# Patient Record
Sex: Male | Born: 1990 | Race: White | Hispanic: No | Marital: Married | Smoking: Former smoker
Health system: Southern US, Community
[De-identification: ages and names within clinical notes are randomized; demographics above are authoritative.]

## PROBLEM LIST (undated history)

## (undated) HISTORY — PX: APPENDECTOMY: SHX54

---
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
## 2009-03-28 ENCOUNTER — Emergency Department: Payer: Self-pay | Admitting: Emergency Medicine

## 2009-05-09 ENCOUNTER — Emergency Department: Payer: Self-pay | Admitting: Emergency Medicine

## 2009-05-22 ENCOUNTER — Ambulatory Visit: Payer: Self-pay | Admitting: Orthopedic Surgery

## 2009-09-19 ENCOUNTER — Ambulatory Visit: Payer: Self-pay | Admitting: Orthopedic Surgery

## 2013-06-13 ENCOUNTER — Inpatient Hospital Stay: Payer: Self-pay | Admitting: Surgery

## 2013-06-13 LAB — CBC
HCT: 47.5 % (ref 40.0–52.0)
MCHC: 35.4 g/dL (ref 32.0–36.0)
MCV: 87 fL (ref 80–100)
Platelet: 284 10*3/uL (ref 150–440)
RDW: 12.8 % (ref 11.5–14.5)
WBC: 18.6 10*3/uL — ABNORMAL HIGH (ref 3.8–10.6)

## 2013-06-13 LAB — COMPREHENSIVE METABOLIC PANEL
Albumin: 4.2 g/dL (ref 3.4–5.0)
Anion Gap: 8 (ref 7–16)
BUN: 13 mg/dL (ref 7–18)
Calcium, Total: 9.1 mg/dL (ref 8.5–10.1)
Chloride: 105 mmol/L (ref 98–107)
Co2: 24 mmol/L (ref 21–32)
EGFR (Non-African Amer.): >60
Glucose: 127 mg/dL — ABNORMAL HIGH (ref 65–99)
Osmolality: 276 (ref 275–301)
Potassium: 3.5 mmol/L (ref 3.5–5.1)
SGOT(AST): 20 U/L (ref 15–37)
Sodium: 137 mmol/L (ref 136–145)

## 2013-06-13 LAB — URINALYSIS, COMPLETE
Hyaline Cast: 14
Nitrite: NEGATIVE
Specific Gravity: 1.041 (ref 1.003–1.030)

## 2013-06-14 LAB — PATHOLOGY REPORT

## 2014-06-17 ENCOUNTER — Emergency Department: Payer: Self-pay | Admitting: Emergency Medicine

## 2014-07-19 ENCOUNTER — Emergency Department: Payer: Self-pay | Admitting: Emergency Medicine

## 2014-10-05 NOTE — H&P (Signed)
Subjective/Chief Complaint rlq abdominal pain   History of Present Illness 24 year old otherwise health male presents to ER with 20 hr history of worsening rlq abdominal pain which started  around 8 am yesterday. No previous episodes of such, some nausea and small amount of emesis.  No sick contacts, Feels hot but no objective fevers   Past History none  hand surgery secondary to a fracture.   Past Med/Surgical Hx:  denies med/surg hx:   Tonsillectomy and Adenoidectomy:   ALLERGIES:  No Known Allergies:   HOME MEDICATIONS: Medication Status  denies home meds Active   Family and Social History:  Family History Non-Contributory   Social History positive  tobacco, negative ETOH   + Tobacco Current (within 1 year)   Place of Living Home   Review of Systems:  Subjective/Chief Complaint see above.   Abdominal Pain Yes   Physical Exam:  GEN no acute distress, obese, temp 97.9 VSS.   HEENT pale conjunctivae, PERRL   NECK supple  trachea midline   RESP normal resp effort  clear BS   CARD regular rate  no murmur  No LE edema   ABD positive tenderness  no liver/spleen enlargement  no hernia  soft  normal BS  neg rovsings sign, positive RLQ focal tenderness.   LYMPH negative neck   EXTR negative cyanosis/clubbing   SKIN normal to palpation, No rashes, No ulcers   NEURO cranial nerves intact   PSYCH A+O to time, place, person, good insight   Lab Results:  Hepatic:  30-Dec-14 02:18   Bilirubin, Total 0.8  Alkaline Phosphatase 88 (45-117 NOTE: New Reference Range 05/05/13)  SGPT (ALT) 24  SGOT (AST) 20  Total Protein, Serum 7.8  Albumin, Serum 4.2  Routine Chem:  30-Dec-14 02:18   Glucose, Serum  127  BUN 13  Creatinine (comp) 0.89  Sodium, Serum 137  Potassium, Serum 3.5  Chloride, Serum 105  CO2, Serum 24  Calcium (Total), Serum 9.1  Osmolality (calc) 276  eGFR (African American) >60  eGFR (Non-African American) >60 (eGFR values <64m/min/1.73 m2  may be an indication of chronic kidney disease (CKD). Calculated eGFR is useful in patients with stable renal function. The eGFR calculation will not be reliable in acutely ill patients when serum creatinine is changing rapidly. It is not useful in  patients on dialysis. The eGFR calculation may not be applicable to patients at the low and high extremes of body sizes, pregnant women, and vegetarians.)  Anion Gap 8  Routine Hem:  30-Dec-14 02:18   WBC (CBC)  18.6  RBC (CBC) 5.49  Hemoglobin (CBC) 16.8  Hematocrit (CBC) 47.5  Platelet Count (CBC) 284 (Result(s) reported on 13 Jun 2013 at 02:52AM.)  MCV 87  MCH 30.6  MCHC 35.4  RDW 12.8   Radiology Results: LabUnknown:    30-Dec-14 02:46, CT Abdomen Pelvis WO for Stone  PACS Image  CT:  CT Abdomen Pelvis WO for Stone  REASON FOR EXAM:    flank pain  COMMENTS:   LMP: (Male)    PROCEDURE: CT  - CT ABDOMEN /PELVIS WO (STONE)  - Jun 13 2013  2:46AM     CLINICAL DATA:  Right-sided abdominal pain since this morning. Right  flank pain.    EXAM:  CT ABDOMEN AND PELVIS WITHOUT CONTRAST    TECHNIQUE:  Multidetector CT imaging of the abdomen and pelvis was performed  following the standard protocol without IV contrast.  COMPARISON:  None.    FINDINGS:  The lung bases are clear.  Small esophageal hiatal hernia.    The kidneys appear symmetrical in size and shape. No  pyelocaliectasis or ureterectasis. No renal, ureteral, or bladder  stones. No bladder wall thickening.    The unenhanced appearance of the liver, spleen, gallbladder,  pancreas, adrenal glands, kidneys, abdominal aorta, inferior vena  cava, and retroperitoneal lymph nodes is unremarkable. The stomach,  small bowel, and colon are not abnormally distended. No free air or  free fluid in the abdomen.  Pelvis: There is inflammatory infiltration in the right lower  quadrant inferior to the cecum and adjacent to the anterior  iliopsoas muscle. The appendix is  segmentally visualized but  partially obscured by inflammatory process. The appendix appears  somewhat distended. Changes are consistent with acuteappendicitis.  Small amount of free fluid in the pelvis is likely reactive.  Prostate gland is not enlarged. Bladder wall is not thickened. No  diverticulitis. Mild degenerative changes in the lumbar spine.     IMPRESSION:  Inflammatory infiltration and fluid in the right lower quadrant  consistent with acute appendicitis. No renal or ureteral stone or  obstruction.    Electronically Signed    By: Lucienne Capers M.D.    On: 06/13/2013 03:23         Verified By: Neale Burly, M.D.,    Assessment/Admission Diagnosis 24 y/o male with acute appendicitis, early.   Plan Laparoscopic appendectomy later this am He already got zosyn in ER all questions addressed.   Electronic Signatures: Sherri Rad (MD)  (Signed 30-Dec-14 04:41)  Authored: CHIEF COMPLAINT and HISTORY, PAST MEDICAL/SURGIAL HISTORY, ALLERGIES, HOME MEDICATIONS, FAMILY AND SOCIAL HISTORY, REVIEW OF SYSTEMS, PHYSICAL EXAM, LABS, Radiology, ASSESSMENT AND PLAN   Last Updated: 30-Dec-14 04:41 by Sherri Rad (MD)

## 2014-10-05 NOTE — Op Note (Signed)
PATIENT NAME:  Benjamin BellowJACOBS, Kaelum T MR#:  [id number] DATE OF BIRTH:  Mar 15, 1991  DATE OF PROCEDURE:  06/13/2013  PREOPERATIVE DIAGNOSIS: Acute appendicitis.   POSTOPERATIVE DIAGNOSIS: Acute appendicitis with rupture.   PROCEDURE PERFORMED: Laparoscopic appendectomy.   ESTIMATED BLOOD LOSS: 20 mL.   COMPLICATIONS: None.   SPECIMEN: Appendix.   ANESTHESIA: General.   INDICATION FOR SURGERY: Mr. Christella HartiganJacobs is a pleasant 24 year old male who presented with acute-onset right lower quadrant pain and leukocytosis. He had a CT scan concerning for appendicitis. He was thus brought to the operating room for appendectomy.   DETAILS OF PROCEDURE: As follows: Informed consent was obtained. Mr. Christella HartiganJacobs was brought to the operating room suite. He was lain supine on the operating room table. He was induced, endotracheal tube was placed, general anesthesia was administered. His abdomen was then prepped and draped in standard surgical fashion. A timeout was then performed, correctly identifying the patient name, operative site and procedure to be performed. A supraumbilical incision was made. This was deepened down to the fascia. The fascia was incised. The peritoneum was entered. Two stay sutures were placed through the fasciotomy. A Hasson trocar was placed in the abdomen, and the abdomen was insufflated. A 10 mm 30 degree scope was placed in the abdomen. There were noted to be numerous dilated bowel loops that appeared to be quite injected. I was able to visualize the appendix. Upon grabbing the appendix, it was noted to be free, and there was a stump still attached to the cecum. An Endo GIA stapler was placed across the base of the appendix flush with the cecum. Two fires of an Endo GIA were then used to ligate the mesoappendix. The appendix was then taken out through an Endo Catch bag. The abdomen was then irrigated. A JP drain was placed in the right lower quadrant to ensure no stump necrosis and leak from the  appendiceal stump. The JP was then sutured with a 3-0 nylon suture. The trocars were then removed under direct visualization. An 0 Vicryl figure-of-eight was used to close the supraumbilical fascia. The other 2 port skin sites were then closed using interrupted 3-0 Vicryl deep dermals. Steri-Strips, Dermabond and Tegaderm were then placed over the wounds. The patient was then awoken, extubated and brought to the postanesthesia care unit. There were no immediate complications. Needle, sponge and instrument counts were correct at the end of the procedure.   ____________________________ Si Raiderhristopher A. Machel Violante, MD cal:lb D: 06/14/2013 10:11:00 ET T: 06/14/2013 11:13:24 ET JOB#: [id number]  cc: Cristal Deerhristopher A. Almyra Birman, MD, <Dictator> Jarvis NewcomerHRISTOPHER A Perpetua Elling MD ELECTRONICALLY SIGNED 06/14/2013 16:11

## 2014-10-06 NOTE — Discharge Summary (Signed)
PATIENT NAME:  Benjamin Santiago, [name] MR#:  [id number] DATE OF BIRTH:  Sep 23, 1990  DATE OF ADMISSION:  06/13/2013 DATE OF DISCHARGE:  06/15/2013  DISCHARGE DIAGNOSES:  1.  Perforated appendicitis.  2.  History of hand surgery secondary to a fracture.   DISCHARGE MEDICATIONS: Percocet 1 to 2 tabs p.o. q.4 hours p.r.n. pain.   INDICATION FOR ADMISSION: Mr. Christella HartiganJacobs is a pleasant 24 year old who presented with acute onset right upper quadrant pain with leukocytosis and CT scan concerning for appendicitis. He was admitted for management of appendicitis.  HOSPITAL COURSE: As follows: Mr. Christella HartiganJacobs underwent laparoscopic appendectomy, which was noted to be ruptured. On postop day 1, he had continued pain and nausea and vomiting. On postop day 2, his nausea and vomiting resolved. He was taking good p.o. with good p.o. pain control. He was voiding and stooling without difficulty. He was sent home with a JP.  DISCHARGE INSTRUCTIONS: As follows: Mr. Christella HartiganJacobs is to follow up in approximately 3 to 5 days. He is to have his JP removed at that time. He is to call or return to the ED if has increased pain, nausea, vomiting, redness or drainage from incision.  ____________________________ Si Raiderhristopher A. Elizbeth Posa, MD cal:aw D: 06/27/2013 12:02:04 ET T: 06/27/2013 12:18:29 ET JOB#: [id number]  cc: Cristal Deerhristopher A. Vester Balthazor, MD, <Dictator> Jarvis NewcomerHRISTOPHER A Nyzaiah Kai MD ELECTRONICALLY SIGNED 06/27/2013 16:45

## 2015-05-01 ENCOUNTER — Encounter: Payer: Self-pay | Admitting: Emergency Medicine

## 2015-05-01 ENCOUNTER — Ambulatory Visit
Admission: EM | Admit: 2015-05-01 | Discharge: 2015-05-01 | Disposition: A | Discharge status: Home / Self Care | Payer: Self-pay | Attending: Family Medicine | Admitting: Family Medicine

## 2015-05-01 DIAGNOSIS — Z0289 Encounter for other administrative examinations: Secondary | ICD-10-CM

## 2015-05-01 LAB — DEPT OF TRANSP DIPSTICK, URINE (ARMC ONLY)
Glucose, UA: NEGATIVE mg/dL
HGB URINE DIPSTICK: NEGATIVE
PROTEIN: NEGATIVE mg/dL
Specific Gravity, Urine: 1.02 (ref 1.005–1.030)

## 2015-05-01 NOTE — ED Notes (Signed)
DOT physical. 

## 2015-05-01 NOTE — ED Provider Notes (Signed)
CSN: [id number]     Arrival date & time 05/01/15  1443 History   First MD Initiated Contact with Patient 05/01/15 1505     Chief Complaint  Patient presents with  . DOT    (Consider location/radiation/quality/duration/timing/severity/associated sxs/prior Treatment) HPI Comments: Patient here for DOT Physical (see scanned form)   The history is provided by the patient.    History reviewed. No pertinent past medical history. Past Surgical History  Procedure Laterality Date  . Appendectomy     History reviewed. No pertinent family history. Social History  Substance Use Topics  . Smoking status: Never Smoker   . Smokeless tobacco: None  . Alcohol Use: No    Review of Systems  Allergies  Review of patient's allergies indicates no known allergies.  Home Medications   Prior to Admission medications   Not on File   Meds Ordered and Administered this Visit  Medications - No data to display  BP 118/65 mmHg  Pulse 90  Temp(Src) 98 F (36.7 C) (Oral)  Resp 15  Ht 5' 5.5" (1.664 m)  Wt 254 lb 12.8 oz (115.577 kg)  BMI 41.74 kg/m2  SpO2 97% No data found.   Physical Exam  ED Course  Procedures (including critical care time)  Labs Review Labs Reviewed  DEPT OF TRANSP DIPSTICK, URINE([facility] ONLY)    Imaging Review No results found.   Visual Acuity Review  Right Eye Distance:   Left Eye Distance:   Bilateral Distance:    Right Eye Near:   Left Eye Near:    Bilateral Near:         MDM   1. Encounter for examination required by Department of Transportation (DOT)    DOT Physical (medically qualified for 2  year; see scanned form)  Payton Mccallumrlando Siddhartha Hoback, MD 05/01/15 1646

## 2015-05-03 ENCOUNTER — Encounter: Payer: Self-pay | Admitting: Internal Medicine

## 2015-05-03 ENCOUNTER — Ambulatory Visit (INDEPENDENT_AMBULATORY_CARE_PROVIDER_SITE_OTHER): Payer: 59 | Admitting: Internal Medicine

## 2015-05-03 VITALS — BP 102/80 | HR 80 | Ht 66.0 in | Wt 259.2 lb

## 2015-05-03 DIAGNOSIS — Z6841 Body Mass Index (BMI) 40.0 and over, adult: Secondary | ICD-10-CM | POA: Insufficient documentation

## 2015-05-03 DIAGNOSIS — Z23 Encounter for immunization: Secondary | ICD-10-CM

## 2015-05-03 DIAGNOSIS — M179 Osteoarthritis of knee, unspecified: Secondary | ICD-10-CM | POA: Diagnosis not present

## 2015-05-03 DIAGNOSIS — M1710 Unilateral primary osteoarthritis, unspecified knee: Secondary | ICD-10-CM | POA: Insufficient documentation

## 2015-05-03 DIAGNOSIS — R0602 Shortness of breath: Secondary | ICD-10-CM

## 2015-05-03 DIAGNOSIS — E663 Overweight: Secondary | ICD-10-CM | POA: Diagnosis not present

## 2015-05-03 DIAGNOSIS — M1730 Unilateral post-traumatic osteoarthritis, unspecified knee: Secondary | ICD-10-CM | POA: Insufficient documentation

## 2015-05-03 MED ORDER — ALBUTEROL SULFATE HFA 108 (90 BASE) MCG/ACT IN AERS: 2.0000 | INHALATION_SPRAY | Freq: Four times a day (QID) | RESPIRATORY_TRACT | Status: DC | PRN

## 2015-05-03 NOTE — Progress Notes (Signed)
Date:  05/03/2015   Name:  Robert BellowMarc T Costantino   DOB:  12/22/1990   MRN:  [id number]   Chief Complaint: Establish Care  New patient here to get established. No current medical complaints but wants to have a primary care available. 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 He is concerned about his weight. He quit smoking several years ago and gained a few pounds. He tries to watch his diet but is not really exercising regularly. He tried phentermine but had an undesirable side effect. He is wondering if there is other medication that might be beneficial. He also notes some shortness of breath with exertion. He does not notice much in the way of wheezing or chest tightness. His brother does have asthma. He admits to being out of shape. Previously used albuterol inhalers when he had an upper respiratory infection.   Review of Systems  Constitutional: Negative for fever, chills, diaphoresis and fatigue.  HENT: Negative for ear pain, tinnitus and trouble swallowing.   Respiratory: Positive for shortness of breath. Negative for cough, chest tightness and wheezing.   Cardiovascular: Negative for chest pain and palpitations.  Gastrointestinal: Negative for abdominal pain, diarrhea and constipation.  Musculoskeletal: Positive for arthralgias (left knee meniscus injury).  Skin: Negative for rash.  Neurological: Negative for light-headedness and headaches.  Psychiatric/Behavioral: Negative for sleep disturbance and dysphoric mood.    There are no active problems to display for this patient.   Prior to Admission medications   Not on File    Allergies  Allergen Reactions  . Phentermine Itching    Past Surgical History  Procedure Laterality Date  . Appendectomy      Social History  Substance Use Topics  . Smoking status: Never Smoker   . Smokeless tobacco: None  . Alcohol Use: No     Medication list has been reviewed and updated.   Physical Exam  Constitutional: He is oriented to person, place, and time. He appears well-developed and well-nourished. No distress.  HENT:  Head: Normocephalic and atraumatic.  Eyes: Right eye exhibits no discharge. Left eye exhibits no discharge. No scleral icterus.  Neck: Normal range of motion. Neck supple. No thyromegaly present.  Cardiovascular: Normal rate, regular rhythm and normal heart sounds.   Pulmonary/Chest: Effort normal and breath sounds normal. No respiratory distress. He has no wheezes. He has no rales.  Musculoskeletal: Normal range of motion. He exhibits no edema or tenderness.  Neurological: He is alert and oriented to person, place, and time. He has normal reflexes.  Skin: Skin is warm and dry. No rash noted.  Psychiatric: He has a normal mood and affect. His behavior is normal. Thought content normal.    BP 102/80 mmHg  Pulse 80  Ht 5\' 6"  (1.676 m)  Wt 259 lb 3.2 oz (117.572 kg)  BMI 41.86 kg/m2  Assessment and Plan: 1. 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 Exertional shortness of breath May have mild exercise induced asthma Keep albuterol inhaler on hand to use if needed - albuterol (PROVENTIL HFA;VENTOLIN HFA) 108 (90 BASE) MCG/ACT inhaler; Inhale 2 puffs into the lungs every 6 (six) hours as needed for wheezing or shortness of breath.  Dispense: 18 g; Refill: 2  3. Overweight on examination Discussed  healthy diet and regular exercise for steady weight loss Weight loss medications are not needed at this time  4. Flu vaccine need - Flu Vaccine QUAD 36+ mos PF IM (Fluarix & Fluzone Quad PF)   Bari Edward, MD [facility]  05/03/2015

## 2015-06-25 ENCOUNTER — Ambulatory Visit (INDEPENDENT_AMBULATORY_CARE_PROVIDER_SITE_OTHER): Payer: 59 | Admitting: Internal Medicine

## 2015-06-25 ENCOUNTER — Ambulatory Visit
Admission: RE | Admit: 2015-06-25 | Discharge: 2015-06-25 | Disposition: A | Discharge status: Home / Self Care | Payer: 59 | Source: Ambulatory Visit | Attending: Internal Medicine | Admitting: Internal Medicine

## 2015-06-25 ENCOUNTER — Encounter: Payer: Self-pay | Admitting: Internal Medicine

## 2015-06-25 VITALS — BP 108/66 | HR 84 | Ht 66.0 in | Wt 263.4 lb

## 2015-06-25 DIAGNOSIS — M79672 Pain in left foot: Secondary | ICD-10-CM | POA: Diagnosis not present

## 2015-06-25 DIAGNOSIS — G2581 Restless legs syndrome: Secondary | ICD-10-CM | POA: Diagnosis not present

## 2015-06-25 DIAGNOSIS — M79673 Pain in unspecified foot: Secondary | ICD-10-CM | POA: Insufficient documentation

## 2015-06-25 DIAGNOSIS — K219 Gastro-esophageal reflux disease without esophagitis: Secondary | ICD-10-CM | POA: Diagnosis not present

## 2015-06-25 MED ORDER — TRAMADOL HCL 50 MG PO TABS: 50.0000 mg | ORAL_TABLET | Freq: Three times a day (TID) | ORAL | Status: DC | PRN

## 2015-06-25 MED ORDER — PANTOPRAZOLE SODIUM 40 MG PO TBEC: 40.0000 mg | DELAYED_RELEASE_TABLET | Freq: Every day | ORAL | Status: DC

## 2015-06-25 MED ORDER — ROPINIROLE HCL 0.5 MG PO TABS
0.5000 mg | ORAL_TABLET | Freq: Every evening | ORAL | Status: DC
Start: 2015-06-25 — End: 2015-08-22

## 2015-06-25 NOTE — Patient Instructions (Signed)
Magnesium 400 mg Folic acid (folate) 1 mg Iron 325 mg

## 2015-06-25 NOTE — Progress Notes (Signed)
Date:  06/25/2015   Name:  Benjamin Santiago   DOB:  Jan 25, 1991   MRN:  [id number]   Chief Complaint: Foot Pain and Gastroesophageal Reflux Foot Pain This is a new problem. The current episode started more than 1 month ago. The problem occurs daily. The problem has been unchanged. Associated symptoms include abdominal pain and arthralgias. Pertinent negatives include no chest pain, chills, coughing, fatigue, fever, joint swelling, numbness, vomiting or weakness. Associated symptoms comments: Pain in left forefoot. Nothing (always worse at the end of the day when he takes off his work boots) aggravates the symptoms. He has tried NSAIDs for the symptoms.  Gastroesophageal Reflux He complains of abdominal pain, heartburn and water brash. He reports no chest pain or no coughing. This is a chronic problem. The problem occurs constantly. Exacerbated by: no use of etoh, tobacco, nsiads, aspirin. Pertinent negatives include no anemia, fatigue, melena or weight loss. He has tried a histamine-2 antagonist for the symptoms. The treatment provided mild relief. Past procedures do not include H. pylori antibody titer or a UGI.   Restless Leg - has to move his legs constantly starting in the evening.  He has had this for years. Moving around helps briefly.  He has never tried supplements or prescription medication.   Review of Systems  Constitutional: Negative for fever, chills, weight loss and fatigue.  Respiratory: Positive for shortness of breath (on exertion). Negative for cough and chest tightness.   Cardiovascular: Negative for chest pain, palpitations and leg swelling.  Gastrointestinal: Positive for heartburn and abdominal pain. Negative for vomiting, diarrhea, constipation, blood in stool and melena.  Musculoskeletal: Positive for arthralgias. Negative for joint swelling.  Neurological: Negative for tremors, weakness and numbness.  Psychiatric/Behavioral: Positive for sleep disturbance.    Patient  Active Problem List   Diagnosis Date Noted  . 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 Exertional shortness of breath 05/03/2015  . Overweight on examination 05/03/2015    Prior to Admission medications   Medication Sig Start Date End Date Taking? Authorizing Provider  albuterol (PROVENTIL HFA;VENTOLIN HFA) 108 (90 BASE) MCG/ACT inhaler Inhale 2 puffs into the lungs every 6 (six) hours as needed for wheezing or shortness of breath. 05/03/15  Yes Reubin Milan, MD    Allergies  Allergen Reactions  . Phentermine Itching    Past Surgical History  Procedure Laterality Date  . Appendectomy      Social History  Substance Use Topics  . Smoking status: Former Games developer  . Smokeless tobacco: None  . Alcohol Use: No     Medication list has been reviewed and updated.   Physical Exam  Constitutional: He is oriented to person, place, and time. He appears well-developed. No distress.  HENT:  Head: Normocephalic and atraumatic.  Eyes: Conjunctivae are normal. Right eye exhibits no discharge. Left eye exhibits no discharge. No scleral icterus.  Neck: Normal range of motion. Neck supple. No thyromegaly present.  Cardiovascular: Normal rate, regular rhythm, normal heart sounds and normal pulses.   Pulmonary/Chest: Effort normal and breath sounds normal. No respiratory distress. He has no wheezes.  Abdominal: Soft. Normal appearance and bowel sounds are normal. There is tenderness in the epigastric area. There is no rebound and no guarding.  Musculoskeletal: Normal range of motion. He exhibits no edema.       Feet:  Neurological: He is alert and oriented to person, place, and time.  Skin: Skin is warm and dry. No rash noted.  Psychiatric: He has a normal mood and affect. His behavior is normal. Thought content normal.  Nursing note and vitals reviewed.   BP 108/66 mmHg  Pulse 84  Ht 5\' 6"  (1.676 m)  Wt 263 lb 6.4 oz (119.477 kg)  BMI 42.53 kg/m2  Assessment  and Plan: 1. Foot pain, left Possible Morton's neuroma versus stress fracture Will likely need podiatry referral - DG Foot Complete Left; Future - traMADol (ULTRAM) 50 MG tablet; Take 1 tablet (50 mg total) by mouth every 8 (eight) hours as needed.  Dispense: 30 tablet; Refill: 0  2. Restless leg syndrome Begin supplementation with magnesium 400 mg, iron 325 mg, and folic acid 1 mg daily - rOPINIRole (REQUIP) 0.5 MG tablet; Take 1 tablet (0.5 mg total) by mouth every evening.  Dispense: 30 tablet; Refill: 5  3. Gastroesophageal reflux disease, esophagitis presence not specified Begin PPI If no improvement will recommend additional testing - pantoprazole (PROTONIX) 40 MG tablet; Take 1 tablet (40 mg total) by mouth daily.  Dispense: 30 tablet; Refill: 3   Bari EdwardLaura Dayden Viverette, MD [facility]  06/25/2015

## 2015-06-26 ENCOUNTER — Telehealth: Payer: Self-pay

## 2015-06-26 NOTE — Telephone Encounter (Signed)
-----   Message from Reubin MilanLaura H Berglund, MD sent at 06/25/2015  4:27 PM EST ----- Xray shows no fracture.  Would recommend Podiatry referral to further evaluate.

## 2015-06-26 NOTE — Telephone Encounter (Signed)
Left message for patient to call back  

## 2015-06-26 NOTE — Telephone Encounter (Signed)
Spoke with pt. Pt. Advised of results and verbalized understanding. MAH 

## 2015-07-18 ENCOUNTER — Other Ambulatory Visit: Payer: Self-pay | Admitting: Internal Medicine

## 2015-07-22 ENCOUNTER — Other Ambulatory Visit: Payer: Self-pay

## 2015-07-22 DIAGNOSIS — M79672 Pain in left foot: Secondary | ICD-10-CM

## 2015-07-22 MED ORDER — TRAMADOL HCL 50 MG PO TABS: 50.0000 mg | ORAL_TABLET | Freq: Two times a day (BID) | ORAL | Status: DC | PRN

## 2015-07-22 NOTE — Telephone Encounter (Signed)
Patient called in today requesting refill of medication.

## 2015-07-29 ENCOUNTER — Encounter: Payer: Self-pay | Admitting: Internal Medicine

## 2015-07-29 ENCOUNTER — Ambulatory Visit (INDEPENDENT_AMBULATORY_CARE_PROVIDER_SITE_OTHER): Payer: 59 | Admitting: Internal Medicine

## 2015-07-29 VITALS — BP 112/70 | HR 80 | Ht 66.0 in | Wt 263.0 lb

## 2015-07-29 DIAGNOSIS — T63301A Toxic effect of unspecified spider venom, accidental (unintentional), initial encounter: Secondary | ICD-10-CM | POA: Diagnosis not present

## 2015-07-29 MED ORDER — DOXYCYCLINE HYCLATE 100 MG PO TABS: 100.0000 mg | ORAL_TABLET | Freq: Two times a day (BID) | ORAL | Status: DC

## 2015-07-29 NOTE — Patient Instructions (Signed)
Spider Bite °Spider bites are not common. When spider bites do happen, most do not cause serious health problems. There are only a few types of spider bites that can cause serious health problems. °CAUSES °A spider bite usually happens when a person accidentally makes contact with a spider in a way that traps the spider against the person's skin. °SYMPTOMS °Symptoms may vary depending on the type of spider. Some spider bites may cause symptoms within 1 hour after the bite. For other spider bites, it may take 1-2 days for symptoms to develop. Common symptoms include: °· Redness and swelling in the area of the bite. °· Discomfort or pain in the area of the bite. °A few types of spiders, such as the black widow spider or the brown recluse spider, can inject poison (venom) into a bite wound. This venom causes more serious symptoms. Symptoms of a venomous spider bite vary, and may include: °· Muscle cramps. °· Nausea, vomiting, or abdominal pain. °· Fever. °· A skin sore (lesion) that spreads. This can break into an open wound (skin ulcer). °· Light-headedness or dizziness. °DIAGNOSIS °This condition may be diagnosed based on your symptoms and a physical exam. Your health care provider will ask about the history of your injury and any details you may have about the spider. This may help to determine what type of spider it was that bit you. °TREATMENT °Many spider bites do not require treatment. If needed, treatment may include: °· Icing and keeping the bite area raised (elevated). °· Over-the-counter or prescription medicines to help control symptoms. °· A tetanus shot. °· Antibiotic medicines. °HOME CARE INSTRUCTIONS °Medicines °· Take or apply over-the-counter and prescription medicines only as told by your health care provider. °· If you were prescribed an antibiotic medicine, take or apply it as told by your health care provider. Do not stop using the antibiotic even if your condition improves. °General  Instructions °· Do not scratch the bite area. °· Keep the bite area clean and dry. Wash the bite area daily with soap and water as told by your health care provider. °· If directed, apply ice to the bite area. °¨ Put ice in a plastic bag. °¨ Place a towel between your skin and the bag. °¨ Leave the ice on for 20 minutes, 2-3 times per day. °· Elevate the affected area above the level of your heart while you are sitting or lying down, if possible. °· Keep all follow-up visits as told by your health care provider. This is important. °SEEK MEDICAL CARE IF: °· Your bite does not get better after 3 days of treatment. °· Your bite turns black or purple. °· You have increased redness, swelling, or pain at the site of the bite. °SEEK IMMEDIATE MEDICAL CARE IF: °· You develop shortness of breath or chest pain. °· You have fluid, blood, or pus coming from the bite area. °· You have muscle cramps or painful muscle spasms. °· You develop abdominal pain, nausea, or vomiting. °· You feel unusually tired (fatigued) or sleepy. °  °This information is not intended to replace advice given to you by your health care provider. Make sure you discuss any questions you have with your health care provider. °  °Document Released: 07/09/2004 Document Revised: 02/20/2015 Document Reviewed: 10/17/2014 °[employer]vier Interactive Patient Education ©2016 [employer] ° °

## 2015-07-29 NOTE — Progress Notes (Signed)
    Date:  07/29/2015   Name:  Benjamin Santiago   DOB:  1990-12-02   MRN:  [id number]   Chief Complaint: Skin Lesion HPI Noticed a lesion on right outer lower leg 2 days ago.  Very tender and red with a dark center.  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   Review of Systems  Constitutional: Negative for fever, chills and fatigue.  Respiratory: Negative for chest tightness and shortness of breath.   Cardiovascular: Negative for chest pain and leg swelling.  Skin: Positive for rash.    Patient Active Problem List   Diagnosis Date Noted  . Foot pain, left 06/25/2015  . Restless leg syndrome 06/25/2015  . Esophageal reflux 06/25/2015  . 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 Exertional shortness of breath 05/03/2015  . Overweight on examination 05/03/2015    Prior to Admission medications   Medication Sig Start Date End Date Taking? Authorizing Provider  albuterol (PROVENTIL HFA;VENTOLIN HFA) 108 (90 BASE) MCG/ACT inhaler Inhale 2 puffs into the lungs every 6 (six) hours as needed for wheezing or shortness of breath. 05/03/15  Yes Reubin Milan, MD  pantoprazole (PROTONIX) 40 MG tablet Take 1 tablet (40 mg total) by mouth daily. 06/25/15  Yes Reubin Milan, MD  rOPINIRole (REQUIP) 0.5 MG tablet Take 1 tablet (0.5 mg total) by mouth every evening. 06/25/15  Yes Reubin Milan, MD  traMADol (ULTRAM) 50 MG tablet Take 1 tablet (50 mg total) by mouth every 12 (twelve) hours as needed. 07/22/15  Yes Reubin Milan, MD    Allergies  Allergen Reactions  . Phentermine Itching    Past Surgical History  Procedure Laterality Date  . Appendectomy      Social History  Substance Use Topics  . Smoking status: Former Games developer  . Smokeless tobacco: None  . Alcohol Use: No     Medication list has been reviewed and updated.   Physical Exam  Constitutional: He is oriented to person, place, and time. He appears well-developed. No distress.    HENT:  Head: Normocephalic and atraumatic.  Pulmonary/Chest: Effort normal. No respiratory distress.  Musculoskeletal: Normal range of motion.  Neurological: He is alert and oriented to person, place, and time.  Skin: Skin is warm and dry. No rash noted.     Psychiatric: He has a normal mood and affect. His behavior is normal. Thought content normal.    BP 112/70 mmHg  Pulse 80  Ht  (1.676 m)  Wt 263 lb (119.296 kg)  BMI 42.47 kg/m2  Assessment and Plan: 1. Spider bite, accidental or unintentional, initial encounter Cold compresses as needed Elevate  - doxycycline (VIBRA-TABS) 100 MG tablet; Take 1 tablet (100 mg total) by mouth 2 (two) times daily.  Dispense: 20 tablet; Refill: 0   Bari Edward, MD [facility]  07/29/2015

## 2015-08-22 ENCOUNTER — Other Ambulatory Visit: Payer: Self-pay

## 2015-08-22 DIAGNOSIS — G2581 Restless legs syndrome: Secondary | ICD-10-CM

## 2015-08-22 DIAGNOSIS — K219 Gastro-esophageal reflux disease without esophagitis: Secondary | ICD-10-CM

## 2015-08-22 MED ORDER — PANTOPRAZOLE SODIUM 40 MG PO TBEC
40.0000 mg | DELAYED_RELEASE_TABLET | Freq: Every day | ORAL | Status: DC
Start: 2015-08-22 — End: 2016-01-21

## 2015-08-22 MED ORDER — ROPINIROLE HCL 0.5 MG PO TABS: 0.5000 mg | ORAL_TABLET | Freq: Every evening | ORAL | Status: DC

## 2015-08-22 NOTE — Telephone Encounter (Signed)
Received fax requesting medications from mail order pharmacy.

## 2015-10-30 ENCOUNTER — Other Ambulatory Visit: Payer: Self-pay | Admitting: Internal Medicine

## 2015-10-30 ENCOUNTER — Ambulatory Visit (INDEPENDENT_AMBULATORY_CARE_PROVIDER_SITE_OTHER): Payer: 59 | Admitting: Internal Medicine

## 2015-10-30 ENCOUNTER — Encounter: Payer: Self-pay | Admitting: Internal Medicine

## 2015-10-30 VITALS — BP 118/82 | HR 91 | Resp 16 | Ht 68.0 in | Wt 258.0 lb

## 2015-10-30 DIAGNOSIS — E663 Overweight: Secondary | ICD-10-CM | POA: Diagnosis not present

## 2015-10-30 DIAGNOSIS — G4730 Sleep apnea, unspecified: Secondary | ICD-10-CM

## 2015-10-30 DIAGNOSIS — R0683 Snoring: Secondary | ICD-10-CM

## 2015-10-30 DIAGNOSIS — R29818 Other symptoms and signs involving the nervous system: Secondary | ICD-10-CM

## 2015-10-30 MED ORDER — PHENTERMINE HCL 37.5 MG PO CAPS: 37.5000 mg | ORAL_CAPSULE | ORAL | Status: DC

## 2015-10-30 NOTE — Progress Notes (Signed)
Date:  10/30/2015   Name:  Benjamin Santiago   DOB:  1991/05/18   MRN:  [id number]   Chief Complaint: Sleep Apnea Patient snores and is concerned about sleep apnea. His wife has noticed that he stops breathing while sleeping. He has gained 10 lbs since November 2016. His med list includes Phentermine that was prescribed by the [facility] MD. He complains of non refreshing sleep, morning headaches on occasions, daytime somnolence. Weight gain - he has been taking phentermine that was from the city MD to help him lose weight.  He has become more committed to changing his diet and exercising and would like another Rx if possible.  Review of Systems  Constitutional: Positive for unexpected weight change. Negative for fever, diaphoresis, appetite change and fatigue.  HENT: Negative for congestion, mouth sores and sinus pressure.   Respiratory: Positive for apnea and shortness of breath. Negative for chest tightness and wheezing.   Cardiovascular: Negative for chest pain, palpitations and leg swelling.  Neurological: Positive for headaches. Negative for dizziness, tremors and syncope.  Psychiatric/Behavioral: Negative for sleep disturbance (that he is aware of) and dysphoric mood.    Patient Active Problem List   Diagnosis Date Noted  . Foot pain, left 06/25/2015  . Restless leg syndrome 06/25/2015  . Gastro-esophageal reflux disease without esophagitis 06/25/2015  . Foot pain 06/25/2015  . 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 Exertional shortness of breath 05/03/2015  . Overweight on examination 05/03/2015  . Arthritis of knee, degenerative 05/03/2015    Prior to Admission medications   Medication Sig Start Date End Date Taking? Authorizing Provider  albuterol (PROVENTIL HFA;VENTOLIN HFA) 108 (90 BASE) MCG/ACT inhaler Inhale 2 puffs into the lungs every 6 (six) hours as needed for wheezing or shortness of breath. 05/03/15  Yes Reubin Milan, MD    doxycycline (VIBRA-TABS) 100 MG tablet Take 1 tablet (100 mg total) by mouth 2 (two) times daily. 07/29/15  Yes Reubin Milan, MD  etodolac (LODINE) 500 MG tablet Take 1 tablet by mouth 2 (two) times daily. 08/18/15  Yes Historical Provider, MD  pantoprazole (PROTONIX) 40 MG tablet Take 1 tablet (40 mg total) by mouth daily. 08/22/15  Yes Reubin Milan, MD  phentermine 37.5 MG capsule Take 37.5 mg by mouth every morning.   Yes Historical Provider, MD  Poison Ivy Treatments (ZANFEL) MISC See admin instructions. 10/20/15  Yes Historical Provider, MD  rOPINIRole (REQUIP) 0.5 MG tablet Take 1 tablet (0.5 mg total) by mouth every evening. 08/22/15  Yes Reubin Milan, MD  traMADol (ULTRAM) 50 MG tablet Take 1 tablet (50 mg total) by mouth every 12 (twelve) hours as needed. 07/22/15  Yes Reubin Milan, MD  triamcinolone cream (KENALOG) 0.1 % APPLY TOPICALLY 2 (TWO) TIMES DAILY. 10/20/15  Yes Historical Provider, MD    Allergies  Allergen Reactions  . Phentermine Itching    Past Surgical History  Procedure Laterality Date  . Appendectomy      Social History  Substance Use Topics  . Smoking status: Former Games developer  . Smokeless tobacco: None  . Alcohol Use: No     Medication list has been reviewed and updated.   Physical Exam  Constitutional: He is oriented to person, place, and time. He appears well-developed. No distress.  HENT:  Head: Normocephalic and atraumatic.  Pulmonary/Chest: Effort normal. No respiratory distress.  Musculoskeletal: Normal range of motion.  Neurological: He is alert and oriented to person,  place, and time.  Skin: Skin is warm and dry. No rash noted.  Psychiatric: He has a normal mood and affect. His behavior is normal. Thought content normal.  Nursing note and vitals reviewed.   BP 118/82 mmHg  Pulse 91  Resp 16  Ht 5\' 8"  (1.727 m)  Wt 258 lb (117.028 kg)  BMI 39.24 kg/m2  SpO2 98%  Assessment and Plan: 1. Suspected sleep apnea - Ambulatory referral  to Sleep Studies  2. Snoring  3. Overweight on examination Will give one month for him to work on weight loss - consider another 1-2 months of tx if beneficial - phentermine 37.5 MG capsule; Take 1 capsule (37.5 mg total) by mouth every morning.  Dispense: 30 capsule; Refill: 0   Bari EdwardLaura Najiyah Paris, MD [facility]  10/30/2015

## 2015-12-03 ENCOUNTER — Ambulatory Visit: Payer: 59 | Admitting: Internal Medicine

## 2015-12-04 ENCOUNTER — Other Ambulatory Visit: Payer: Self-pay | Admitting: Internal Medicine

## 2015-12-04 DIAGNOSIS — R0683 Snoring: Secondary | ICD-10-CM

## 2015-12-04 DIAGNOSIS — R29818 Other symptoms and signs involving the nervous system: Secondary | ICD-10-CM

## 2015-12-24 ENCOUNTER — Encounter: Payer: Self-pay | Admitting: Emergency Medicine

## 2015-12-24 ENCOUNTER — Emergency Department
Admission: EM | Admit: 2015-12-24 | Discharge: 2015-12-24 | Disposition: A | Discharge status: Home / Self Care | Payer: 59 | Attending: Emergency Medicine | Admitting: Emergency Medicine

## 2015-12-24 DIAGNOSIS — Y999 Unspecified external cause status: Secondary | ICD-10-CM | POA: Diagnosis not present

## 2015-12-24 DIAGNOSIS — R519 Headache, unspecified: Secondary | ICD-10-CM

## 2015-12-24 DIAGNOSIS — Y939 Activity, unspecified: Secondary | ICD-10-CM | POA: Insufficient documentation

## 2015-12-24 DIAGNOSIS — G4700 Insomnia, unspecified: Secondary | ICD-10-CM | POA: Insufficient documentation

## 2015-12-24 DIAGNOSIS — Z7951 Long term (current) use of inhaled steroids: Secondary | ICD-10-CM | POA: Insufficient documentation

## 2015-12-24 DIAGNOSIS — R51 Headache: Secondary | ICD-10-CM | POA: Diagnosis not present

## 2015-12-24 DIAGNOSIS — S0990XA Unspecified injury of head, initial encounter: Secondary | ICD-10-CM | POA: Diagnosis present

## 2015-12-24 DIAGNOSIS — G479 Sleep disorder, unspecified: Secondary | ICD-10-CM

## 2015-12-24 DIAGNOSIS — Z87891 Personal history of nicotine dependence: Secondary | ICD-10-CM | POA: Diagnosis not present

## 2015-12-24 DIAGNOSIS — Y929 Unspecified place or not applicable: Secondary | ICD-10-CM | POA: Insufficient documentation

## 2015-12-24 MED ORDER — ACETAMINOPHEN 500 MG PO TABS
ORAL_TABLET | ORAL | Status: AC
  Administered 2015-12-24: 1000 mg via ORAL
  Filled 2015-12-24: qty 2

## 2015-12-24 MED ORDER — KETOROLAC TROMETHAMINE 10 MG PO TABS: 10.0000 mg | ORAL_TABLET | Freq: Three times a day (TID) | ORAL | Status: DC | PRN

## 2015-12-24 MED ORDER — ACETAMINOPHEN 500 MG PO TABS
1000.0000 mg | ORAL_TABLET | Freq: Once | ORAL | Status: AC
  Administered 2015-12-24: 1000 mg via ORAL
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
## 2016-01-21 ENCOUNTER — Encounter: Payer: Self-pay | Admitting: Internal Medicine

## 2016-01-21 ENCOUNTER — Ambulatory Visit (INDEPENDENT_AMBULATORY_CARE_PROVIDER_SITE_OTHER): Payer: 59 | Admitting: Internal Medicine

## 2016-01-21 VITALS — BP 118/78 | HR 96 | Resp 16 | Ht 68.0 in | Wt 264.0 lb

## 2016-01-21 DIAGNOSIS — G4700 Insomnia, unspecified: Secondary | ICD-10-CM | POA: Diagnosis not present

## 2016-01-21 DIAGNOSIS — G2581 Restless legs syndrome: Secondary | ICD-10-CM | POA: Diagnosis not present

## 2016-01-21 DIAGNOSIS — G4733 Obstructive sleep apnea (adult) (pediatric): Secondary | ICD-10-CM

## 2016-01-21 DIAGNOSIS — G43C0 Periodic headache syndromes in child or adult, not intractable: Secondary | ICD-10-CM

## 2016-01-21 MED ORDER — ROPINIROLE HCL 1 MG PO TABS: 1.0000 mg | ORAL_TABLET | Freq: Every day | ORAL | 5 refills | Status: DC

## 2016-01-21 MED ORDER — SUMATRIPTAN SUCCINATE 100 MG PO TABS: 100.0000 mg | ORAL_TABLET | ORAL | 0 refills | Status: DC | PRN

## 2016-01-21 MED ORDER — KETOROLAC TROMETHAMINE 10 MG PO TABS: 10.0000 mg | ORAL_TABLET | Freq: Three times a day (TID) | ORAL | 0 refills | Status: DC | PRN

## 2016-01-21 MED ORDER — ETODOLAC 500 MG PO TABS: 500.0000 mg | ORAL_TABLET | Freq: Two times a day (BID) | ORAL | 2 refills | Status: DC

## 2016-01-21 MED ORDER — ZALEPLON 10 MG PO CAPS
10.0000 mg | ORAL_CAPSULE | Freq: Every evening | ORAL | 1 refills | Status: DC | PRN
Start: 2016-01-21 — End: 2019-02-06
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
## 2016-01-23 ENCOUNTER — Other Ambulatory Visit: Payer: Self-pay | Admitting: Internal Medicine

## 2016-01-23 MED ORDER — ETODOLAC 500 MG PO TABS: 500.0000 mg | ORAL_TABLET | Freq: Two times a day (BID) | ORAL | 1 refills | Status: DC

## 2016-02-16 ENCOUNTER — Other Ambulatory Visit: Payer: Self-pay | Admitting: Internal Medicine

## 2016-02-16 ENCOUNTER — Encounter: Payer: Self-pay | Admitting: Internal Medicine

## 2016-02-18 ENCOUNTER — Other Ambulatory Visit: Payer: Self-pay | Admitting: Internal Medicine

## 2016-02-18 DIAGNOSIS — G4733 Obstructive sleep apnea (adult) (pediatric): Secondary | ICD-10-CM

## 2016-03-02 ENCOUNTER — Other Ambulatory Visit: Payer: Self-pay | Admitting: Internal Medicine

## 2016-03-09 ENCOUNTER — Other Ambulatory Visit: Payer: Self-pay | Admitting: Internal Medicine

## 2016-03-09 ENCOUNTER — Telehealth: Payer: Self-pay

## 2016-03-09 DIAGNOSIS — M79672 Pain in left foot: Secondary | ICD-10-CM

## 2016-03-09 MED ORDER — TRAMADOL HCL 50 MG PO TABS: 50.0000 mg | ORAL_TABLET | Freq: Two times a day (BID) | ORAL | 3 refills | Status: DC | PRN

## 2016-03-09 NOTE — Telephone Encounter (Signed)
Tramadol refill [facility] [address]

## 2016-03-25 DIAGNOSIS — G43009 Migraine without aura, not intractable, without status migrainosus: Secondary | ICD-10-CM | POA: Insufficient documentation

## 2016-04-21 ENCOUNTER — Other Ambulatory Visit: Payer: Self-pay | Admitting: Internal Medicine

## 2016-05-01 ENCOUNTER — Other Ambulatory Visit: Payer: Self-pay | Admitting: Internal Medicine

## 2016-05-01 DIAGNOSIS — K219 Gastro-esophageal reflux disease without esophagitis: Secondary | ICD-10-CM

## 2016-08-12 ENCOUNTER — Other Ambulatory Visit: Payer: Self-pay | Admitting: Internal Medicine

## 2016-08-12 DIAGNOSIS — M79672 Pain in left foot: Secondary | ICD-10-CM

## 2016-08-14 ENCOUNTER — Other Ambulatory Visit: Payer: Self-pay | Admitting: Internal Medicine

## 2016-08-14 DIAGNOSIS — M79672 Pain in left foot: Secondary | ICD-10-CM

## 2016-08-20 NOTE — Telephone Encounter (Signed)
Called pt and informed he needs to be seen every 6 months to continue controlled substance medication. Sent to front desk to make an appt--

## 2016-10-15 ENCOUNTER — Other Ambulatory Visit: Payer: Self-pay | Admitting: Internal Medicine

## 2017-01-04 ENCOUNTER — Encounter: Payer: Self-pay | Admitting: *Deleted

## 2017-01-04 ENCOUNTER — Emergency Department
Admission: EM | Admit: 2017-01-04 | Discharge: 2017-01-04 | Disposition: A | Discharge status: Home / Self Care | Payer: 59 | Attending: Emergency Medicine | Admitting: Emergency Medicine

## 2017-01-04 ENCOUNTER — Emergency Department: Payer: 59

## 2017-01-04 DIAGNOSIS — Z79899 Other long term (current) drug therapy: Secondary | ICD-10-CM | POA: Diagnosis not present

## 2017-01-04 DIAGNOSIS — Y9301 Activity, walking, marching and hiking: Secondary | ICD-10-CM | POA: Insufficient documentation

## 2017-01-04 DIAGNOSIS — S99922A Unspecified injury of left foot, initial encounter: Secondary | ICD-10-CM | POA: Diagnosis present

## 2017-01-04 DIAGNOSIS — Y929 Unspecified place or not applicable: Secondary | ICD-10-CM | POA: Insufficient documentation

## 2017-01-04 DIAGNOSIS — S92422A Displaced fracture of distal phalanx of left great toe, initial encounter for closed fracture: Secondary | ICD-10-CM | POA: Diagnosis not present

## 2017-01-04 DIAGNOSIS — Z87891 Personal history of nicotine dependence: Secondary | ICD-10-CM | POA: Diagnosis not present

## 2017-01-04 DIAGNOSIS — W1849XA Other slipping, tripping and stumbling without falling, initial encounter: Secondary | ICD-10-CM | POA: Insufficient documentation

## 2017-01-04 DIAGNOSIS — Y998 Other external cause status: Secondary | ICD-10-CM | POA: Insufficient documentation

## 2017-01-04 MED ORDER — MELOXICAM 15 MG PO TABS: 15.0000 mg | ORAL_TABLET | Freq: Every day | ORAL | 0 refills | Status: DC

## 2017-01-04 MED ORDER — HYDROCODONE-ACETAMINOPHEN 5-325 MG PO TABS: 1.0000 | ORAL_TABLET | ORAL | 0 refills | Status: DC | PRN

## 2017-01-04 MED ORDER — HYDROCODONE-ACETAMINOPHEN 5-325 MG PO TABS
1.0000 | ORAL_TABLET | Freq: Once | ORAL | Status: AC
  Administered 2017-01-04: 1 via ORAL

## 2017-01-04 MED ORDER — HYDROCODONE-ACETAMINOPHEN 5-325 MG PO TABS
ORAL_TABLET | ORAL | Status: AC
  Filled 2017-01-04: qty 1

## 2017-01-04 NOTE — ED Notes (Signed)
Pt tripped over a hose and injured L big toe, bruising and swelling noted. Pt states he has been walking on it for a few hours since injury but pain is increased. Can move L big toe.

## 2017-01-04 NOTE — ED Notes (Signed)
X-ray at bedside
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
## 2017-01-04 NOTE — ED Provider Notes (Signed)
[facility] Emergency Department Provider Note  ____________________________________________  Time seen: Approximately 11:29 PM  I have reviewed the triage vital signs and the nursing notes.   HISTORY  Chief Complaint Toe Injury    HPI Benjamin Santiago is a 26 y.o. male who presents to emergency department complaining of great toe pain to left foot. Patient reports that he is walking minus Topic[place], and a water hose. Patient reports he tripped landing with all his weight on his great toe. Patient reports pain, swelling, bruising to the affected digit. No other injury or complaint. No medications prior to arrival.   No past medical history on file.  Patient Active Problem List   Diagnosis Date Noted  . OSA (obstructive sleep apnea) 01/21/2016  . Foot pain, left 06/25/2015  . Restless leg syndrome 06/25/2015  . Gastro-esophageal reflux disease without esophagitis 06/25/2015  . Foot pain 06/25/2015  . 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 Exertional shortness of breath 05/03/2015  . Overweight on examination 05/03/2015  . Arthritis of knee, degenerative 05/03/2015    Past Surgical History:  Procedure Laterality Date  . APPENDECTOMY      Prior to Admission medications   Medication Sig Start Date End Date Taking? Authorizing Provider  albuterol (PROVENTIL HFA;VENTOLIN HFA) 108 (90 Base) MCG/ACT inhaler Inhale into the lungs. 05/03/15   [provider]  etodolac (LODINE) 500 MG tablet Take 1 tablet (500 mg total) by mouth 2 (two) times daily. 01/23/16   Reubin MilanBerglund, Laura H, MD  ketorolac (TORADOL) 10 MG tablet Take 1 tablet (10 mg total) by mouth every 8 (eight) hours as needed for moderate pain (with food). 01/21/16   Reubin MilanBerglund, Laura H, MD  pantoprazole (PROTONIX) 40 MG tablet Take by mouth. 08/22/15   [provider]  pantoprazole (PROTONIX) 40 MG tablet TAKE 1 TABLET BY MOUTH  DAILY 05/02/16   Reubin MilanBerglund, Laura H, MD   pantoprazole (PROTONIX) 40 MG tablet TAKE 1 TABLET BY MOUTH  DAILY 10/16/16   Reubin MilanBerglund, Laura H, MD  phentermine 37.5 MG capsule Take 1 capsule (37.5 mg total) by mouth every morning. 10/30/15   Reubin MilanBerglund, Laura H, MD  rOPINIRole (REQUIP) 1 MG tablet Take 1 tablet (1 mg total) by mouth at bedtime. 01/21/16   Reubin MilanBerglund, Laura H, MD  SUMAtriptan (IMITREX) 100 MG tablet TAKE 1 TABLET EVERY 2 HOURS AS NEEDED FOR MIGRAINE. MAY REPEAT IN 2HRS IF HEADACHE PERSISTS OR RECUR 04/22/16   Reubin MilanBerglund, Laura H, MD  traMADol (ULTRAM) 50 MG tablet Take 1 tablet (50 mg total) by mouth every 12 (twelve) hours as needed. 03/09/16   Reubin MilanBerglund, Laura H, MD  zaleplon (SONATA) 10 MG capsule Take 1 capsule (10 mg total) by mouth at bedtime as needed for sleep. 01/21/16   Reubin MilanBerglund, Laura H, MD    Allergies Patient has no known allergies.  Family History  Problem Relation Age of Onset  . Diabetes Mother   . Asthma Brother     Social History Social History  Substance Use Topics  . Smoking status: Former Games developermoker  . Smokeless tobacco: Never Used  . Alcohol use No     Review of Systems  Constitutional: No fever/chills Cardiovascular: no chest pain. Respiratory: no cough. No SOB. Musculoskeletal: Positive for pain to the great toe of the left foot Skin: Negative for rash, abrasions, lacerations, ecchymosis. Neurological: Negative for headaches, focal weakness or numbness. 10-point ROS otherwise negative.  ____________________________________________   PHYSICAL EXAM:  VITAL SIGNS: ED Triage  Vitals  Enc Vitals Group     BP 01/04/17 2146 110/73     Pulse Rate 01/04/17 2142 (!) 102     Resp 01/04/17 2142 20     Temp 01/04/17 2142 98.2 F (36.8 C)     Temp Source 01/04/17 2142 Oral     SpO2 01/04/17 2142 100 %     Weight 01/04/17 2143 265 lb (120.2 kg)     Height 01/04/17 2143 5\' 6"  (1.676 m)     Head Circumference --      Peak Flow --      Pain Score 01/04/17 2142 10     Pain Loc --      Pain Edu? --       Excl. in GC? --      Constitutional: Alert and oriented. Well appearing and in no acute distress. Eyes: Conjunctivae are normal. PERRL. EOMI. Head: Atraumatic. Neck: No stridor.    Cardiovascular: Normal rate, regular rhythm. Normal S1 and S2.  Good peripheral circulation. Respiratory: Normal respiratory effort without tachypnea or retractions. Lungs CTAB. Good air entry to the bases with no decreased or absent breath sounds. Musculoskeletal: Full range of motion to all extremities. No gross deformities appreciated. Ecchymosis and edema noted to the great toe of the left foot. Full range of motion of all the digits and ankle. Patient is tender to palpation over the distal great toe. No palpable abnormality. Sensation and cap refill intact. Neurologic:  Normal speech and language. No gross focal neurologic deficits are appreciated.  Skin:  Skin is warm, dry and intact. No rash noted. Psychiatric: Mood and affect are normal. Speech and behavior are normal. Patient exhibits appropriate insight and judgement.   ____________________________________________   LABS (all labs ordered are listed, but only abnormal results are displayed)  Labs Reviewed - No data to display ____________________________________________  EKG   ____________________________________________  RADIOLOGY Festus Barren Daphanie Oquendo, personally viewed and evaluated these images (plain radiographs) as part of my medical decision making, as well as reviewing the written report by the radiologist.  Dg Foot Complete Left  Result Date: 01/04/2017 CLINICAL DATA:  Left foot pain after injury. Tripped over a hose is injuring big toe. Bruising and swelling. EXAM: LEFT FOOT - COMPLETE 3+ VIEW COMPARISON:  None. FINDINGS: Acute fracture of the great toe distal phalanx is minimally displaced with extension into the interphalangeal joint. Articular offset is less than 1 mm. No additional fracture of the foot. The alignment is  maintained. Soft tissue edema at the fracture site. IMPRESSION: Intraarticular great toe distal phalanx fracture with mild displacement. Electronically Signed   By: Rubye Oaks M.D.   On: 01/04/2017 22:13    ____________________________________________    PROCEDURES  Procedure(s) performed:    Procedures    Medications - No data to display   ____________________________________________   INITIAL IMPRESSION / ASSESSMENT AND PLAN / ED COURSE  Pertinent labs & imaging results that were available during my care of the patient were reviewed by me and considered in my medical decision making (see chart for details).  Review of the [place] CSRS was performed in accordance of the [facility] prior to dispensing any controlled drugs.     Patient's diagnosis is consistent with mildly displaced fracture of the distal phalanx of the great toe left foot. X-ray reveals the above diagnosis. Toe is buddy taped to the adjoining toe and given postop shoe. Patient will be prescribed limited pain medication and anti-inflammatories for symptom control. Patient will follow  up with primary care or podiatry as needed..  Patient is given ED precautions to return to the ED for any worsening or new symptoms.     ____________________________________________  FINAL CLINICAL IMPRESSION(S) / ED DIAGNOSES  Final diagnoses:  None      NEW MEDICATIONS STARTED DURING THIS VISIT:  New Prescriptions   No medications on file        This chart was dictated using voice recognition software/Dragon. Despite best efforts to proofread, errors can occur which can change the meaning. Any change was purely unintentional.    Racheal Patches, PA-C 01/04/17 2345    Phineas Semen, MD 01/06/17 [phone]

## 2017-03-30 ENCOUNTER — Other Ambulatory Visit: Payer: Self-pay | Admitting: Internal Medicine

## 2017-04-02 ENCOUNTER — Encounter: Payer: Self-pay | Admitting: Internal Medicine

## 2017-04-02 ENCOUNTER — Ambulatory Visit (INDEPENDENT_AMBULATORY_CARE_PROVIDER_SITE_OTHER): Payer: 59 | Admitting: Internal Medicine

## 2017-04-02 VITALS — BP 122/64 | Temp 89.0°F | Ht 68.0 in | Wt 281.0 lb

## 2017-04-02 DIAGNOSIS — M79672 Pain in left foot: Secondary | ICD-10-CM

## 2017-04-02 DIAGNOSIS — J4520 Mild intermittent asthma, uncomplicated: Secondary | ICD-10-CM | POA: Diagnosis not present

## 2017-04-02 DIAGNOSIS — Z23 Encounter for immunization: Secondary | ICD-10-CM

## 2017-04-02 DIAGNOSIS — G2581 Restless legs syndrome: Secondary | ICD-10-CM | POA: Diagnosis not present

## 2017-04-02 DIAGNOSIS — E663 Overweight: Secondary | ICD-10-CM | POA: Diagnosis not present

## 2017-04-02 MED ORDER — ALBUTEROL SULFATE HFA 108 (90 BASE) MCG/ACT IN AERS: 2.0000 | INHALATION_SPRAY | RESPIRATORY_TRACT | 5 refills | Status: DC | PRN

## 2017-04-02 MED ORDER — HYDROCODONE-ACETAMINOPHEN 5-325 MG PO TABS: 1.0000 | ORAL_TABLET | ORAL | 0 refills | Status: DC | PRN

## 2017-04-02 MED ORDER — ROPINIROLE HCL 1 MG PO TABS: 1.0000 mg | ORAL_TABLET | Freq: Every day | ORAL | 5 refills | Status: DC

## 2017-04-02 MED ORDER — PHENTERMINE HCL 37.5 MG PO CAPS: 37.5000 mg | ORAL_CAPSULE | ORAL | 0 refills | Status: DC

## 2017-04-02 NOTE — Progress Notes (Signed)
Date:  04/02/2017   Name:  Benjamin Santiago   DOB:  Dec 08, 1990   MRN:  [id number]   Chief Complaint: BMI (Discuss BMI for work. ); Asthma (Needs refill on inhaler.); and restless leg (Needs refill on ropinirole. ) Elevated BMI - has paperwork from [facility] so needs a plan and exception. He has been struggling with his weight - can not seem to lose any.  He is too busy with 2 jobs and just can not do a program such as [employer] or [employer].  We discussed healthy diet and possible diet medication.  Asthma  He complains of chest tightness, shortness of breath and wheezing. There is no cough. The current episode started more than 1 year ago. The problem occurs intermittently. The problem has been unchanged. Pertinent negatives include no chest pain or trouble swallowing. His symptoms are alleviated by beta-agonist (has never been on a maintenance medication). His past medical history is significant for asthma.   Restless leg syndrome - he did well on Ropinirole but ran out of medication.  Over the counter remedies did not help.  He would like a refill.  Toe fracture - still having some pain although healing.  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   Review of Systems  Constitutional: Positive for unexpected weight change. Negative for chills, diaphoresis and fatigue.  HENT: Negative for trouble swallowing.   Respiratory: Positive for chest tightness, shortness of breath and wheezing. Negative for cough and stridor.   Cardiovascular: Negative for chest pain, palpitations and leg swelling.  Gastrointestinal: Negative for abdominal pain.  Neurological: Negative for dizziness, weakness and light-headedness.    Patient Active Problem List   Diagnosis Date Noted  . OSA (obstructive sleep apnea) 01/21/2016  . Foot pain, left 06/25/2015  . Restless leg syndrome 06/25/2015  . Gastro-esophageal reflux disease without esophagitis 06/25/2015   . Foot pain 06/25/2015  . 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 Exertional shortness of breath 05/03/2015  . Overweight on examination 05/03/2015  . Arthritis of knee, degenerative 05/03/2015    Prior to Admission medications   Medication Sig Start Date End Date Taking? Authorizing Provider  albuterol (PROVENTIL HFA;VENTOLIN HFA) 108 (90 Base) MCG/ACT inhaler Inhale into the lungs. 05/03/15  Yes [provider]  etodolac (LODINE) 500 MG tablet Take 1 tablet (500 mg total) by mouth 2 (two) times daily. 01/23/16  Yes Reubin Milan, MD  HYDROcodone-acetaminophen (NORCO/VICODIN) 5-325 MG tablet Take 1 tablet by mouth every 4 (four) hours as needed for moderate pain. 01/04/17  Yes Cuthriell, Delorise Royals, PA-C  ketorolac (TORADOL) 10 MG tablet Take 1 tablet (10 mg total) by mouth every 8 (eight) hours as needed for moderate pain (with food). 01/21/16  Yes Reubin Milan, MD  meloxicam (MOBIC) 15 MG tablet Take 1 tablet (15 mg total) by mouth daily. 01/04/17  Yes Cuthriell, Delorise Royals, PA-C  pantoprazole (PROTONIX) 40 MG tablet TAKE 1 TABLET BY MOUTH  DAILY 03/31/17  Yes Reubin Milan, MD  traMADol (ULTRAM) 50 MG tablet Take 1 tablet (50 mg total) by mouth every 12 (twelve) hours as needed. 03/09/16  Yes Reubin Milan, MD  zaleplon (SONATA) 10 MG capsule Take 1 capsule (10 mg total) by mouth at bedtime as needed for sleep. 01/21/16  Yes Reubin Milan, MD  rOPINIRole (REQUIP) 1 MG tablet Take 1 tablet (1  mg total) by mouth at bedtime. Patient not taking: Reported on 04/02/2017 01/21/16   Reubin MilanBerglund, Shelia Kingsberry H, MD  SUMAtriptan (IMITREX) 100 MG tablet TAKE 1 TABLET EVERY 2 HOURS AS NEEDED FOR MIGRAINE. MAY REPEAT IN 2HRS IF HEADACHE PERSISTS OR RECUR Patient not taking: Reported on 04/02/2017 04/22/16   Reubin MilanBerglund, Rebeckah Masih H, MD    No Known Allergies  Past Surgical History:  Procedure Laterality Date  . APPENDECTOMY      Social History  Substance Use Topics  .  Smoking status: Former Games developermoker  . Smokeless tobacco: Never Used  . Alcohol use No     Medication list has been reviewed and updated.  PHQ 2/9 Scores 04/02/2017 10/30/2015  PHQ - 2 Score 0 0    Physical Exam  Constitutional: He is oriented to person, place, and time. He appears well-developed. No distress.  HENT:  Head: Normocephalic and atraumatic.  Neck: Normal range of motion. Neck supple. No thyromegaly present.  Cardiovascular: Normal rate, regular rhythm and normal heart sounds.   Pulmonary/Chest: Effort normal and breath sounds normal. No respiratory distress. He has no wheezes.  Musculoskeletal: He exhibits no edema or tenderness.  Neurological: He is alert and oriented to person, place, and time.  Skin: Skin is warm and dry. No rash noted.  Psychiatric: He has a normal mood and affect. His behavior is normal. Thought content normal.  Nursing note and vitals reviewed.   BP 122/64   Temp (!) 89 F (31.7 C)   Ht 5\' 8"  (1.727 m)   Wt 281 lb (127.5 kg)   SpO2 95%   BMI 42.73 kg/m   Assessment and Plan: 1. Restless leg syndrome Resume medication - rOPINIRole (REQUIP) 1 MG tablet; Take 1 tablet (1 mg total) by mouth at bedtime.  Dispense: 30 tablet; Refill: 5  2. Overweight on examination Diet changes discussed - [facility] form completed and given to patient Begin medication Follow up one month - phentermine 37.5 MG capsule; Take 1 capsule (37.5 mg total) by mouth every morning.  Dispense: 30 capsule; Refill: 0  3. Mild intermittent asthma without complication Sample symbicort 80/4.5 given - 1-2 puffs bid - albuterol (PROVENTIL HFA;VENTOLIN HFA) 108 (90 Base) MCG/ACT inhaler; Inhale 2 puffs into the lungs every 4 (four) hours as needed for wheezing or shortness of breath.  Dispense: 18 g; Refill: 5  4. Need for influenza vaccination - Flu Vaccine QUAD 36+ mos IM  5. Foot pain, left #12 hydrocodone given   Meds ordered this encounter  Medications  . phentermine  37.5 MG capsule    Sig: Take 1 capsule (37.5 mg total) by mouth every morning.    Dispense:  30 capsule    Refill:  0  . HYDROcodone-acetaminophen (NORCO/VICODIN) 5-325 MG tablet    Sig: Take 1 tablet by mouth every 4 (four) hours as needed for moderate pain.    Dispense:  12 tablet    Refill:  0  . albuterol (PROVENTIL HFA;VENTOLIN HFA) 108 (90 Base) MCG/ACT inhaler    Sig: Inhale 2 puffs into the lungs every 4 (four) hours as needed for wheezing or shortness of breath.    Dispense:  18 g    Refill:  5  . rOPINIRole (REQUIP) 1 MG tablet    Sig: Take 1 tablet (1 mg total) by mouth at bedtime.    Dispense:  30 tablet    Refill:  5    Partially dictated using Animal nutritionistDragon software. Any errors are unintentional.  Bari EdwardLaura Layne Dilauro, MD  [facility]  04/02/2017

## 2017-04-02 NOTE — Patient Instructions (Signed)
Symbicort - 1-2 puffs twice a day.  You can still use the albuterol inhaler as needed.  Begin a low carb diet - avoid all fast food, most breads/rice/pasta and potatoes but eat plenty of green and yellow vegetables and lean meats.

## 2017-04-20 ENCOUNTER — Other Ambulatory Visit: Payer: Self-pay | Admitting: Internal Medicine

## 2017-04-20 DIAGNOSIS — G2581 Restless legs syndrome: Secondary | ICD-10-CM

## 2017-04-20 MED ORDER — ROPINIROLE HCL 1 MG PO TABS: 1.0000 mg | ORAL_TABLET | Freq: Every day | ORAL | 1 refills | Status: DC

## 2017-05-14 ENCOUNTER — Ambulatory Visit: Payer: 59 | Admitting: Internal Medicine

## 2017-05-14 IMAGING — CR DG FOOT COMPLETE 3+V*L*
3 series · 4 of 4 positions shown · non-contrast
Comparison: None

CLINICAL DATA: Left third through fourth metatarsal pain.

EXAM:
LEFT FOOT - COMPLETE 3+ VIEW

[foot ap]
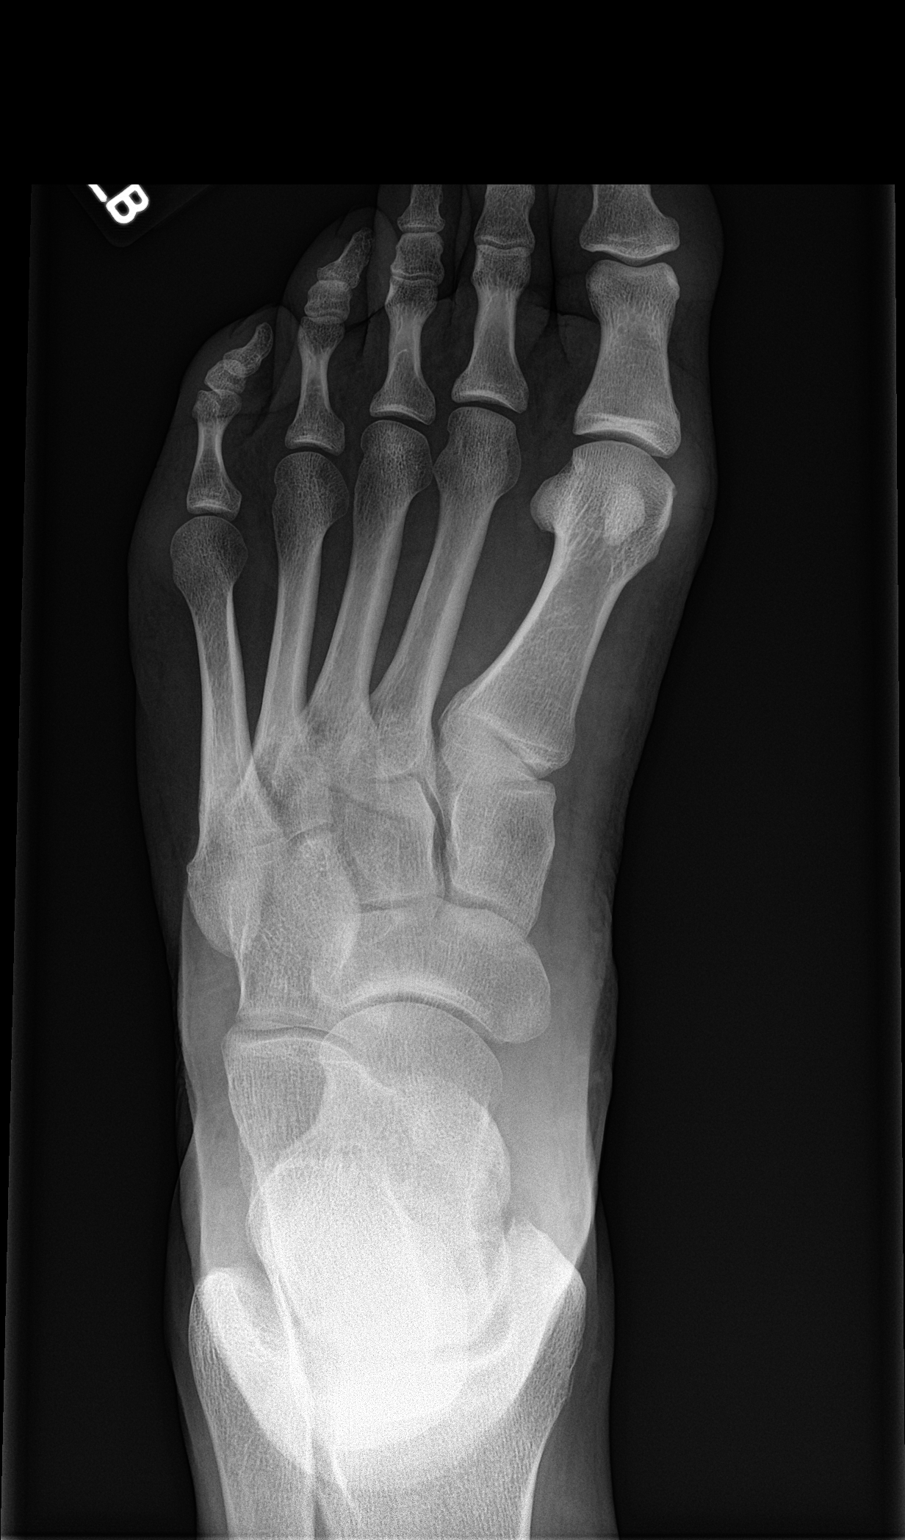

[foot obl]
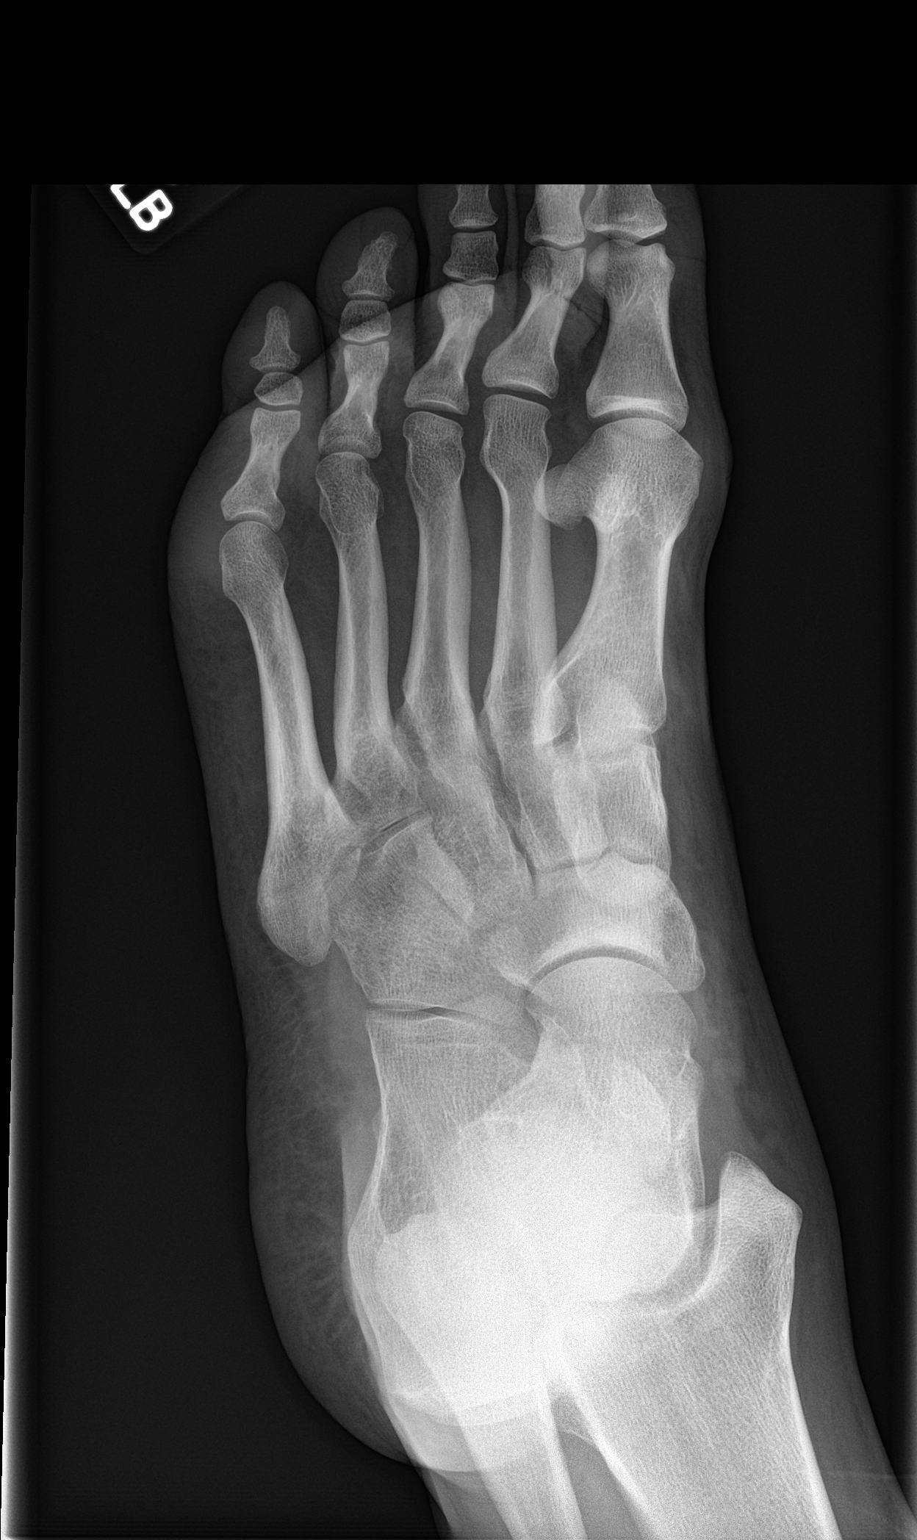

[Series 3: foot lat · 0.14mm/px · 2 of 2 slices shown]
[im 1/2]
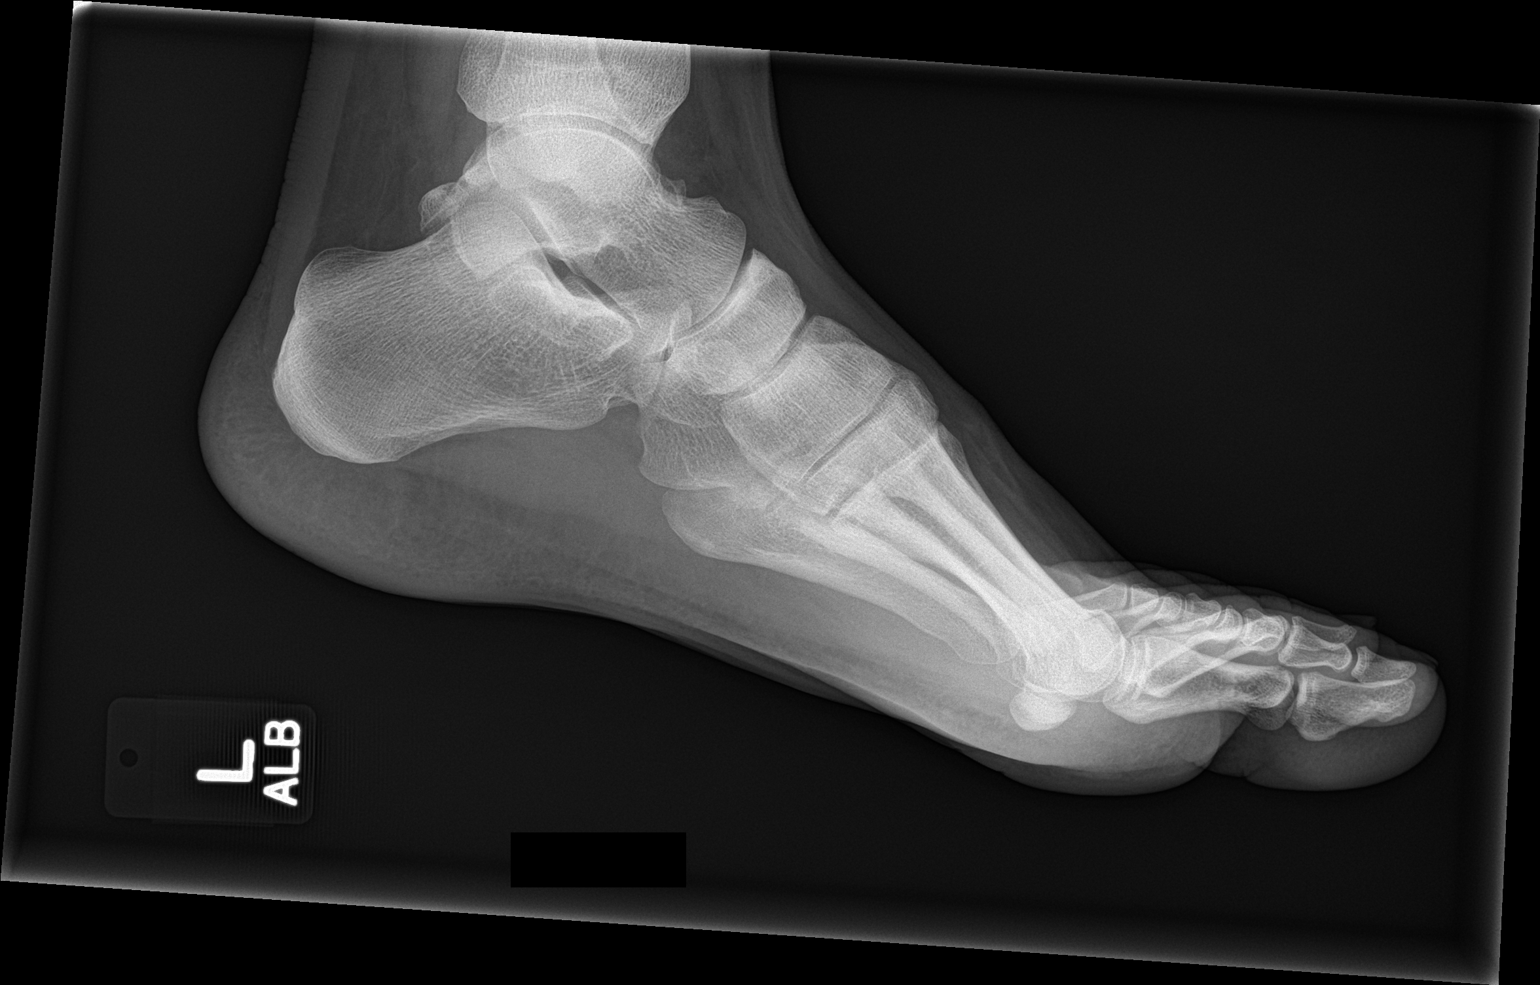
[im 2/2]
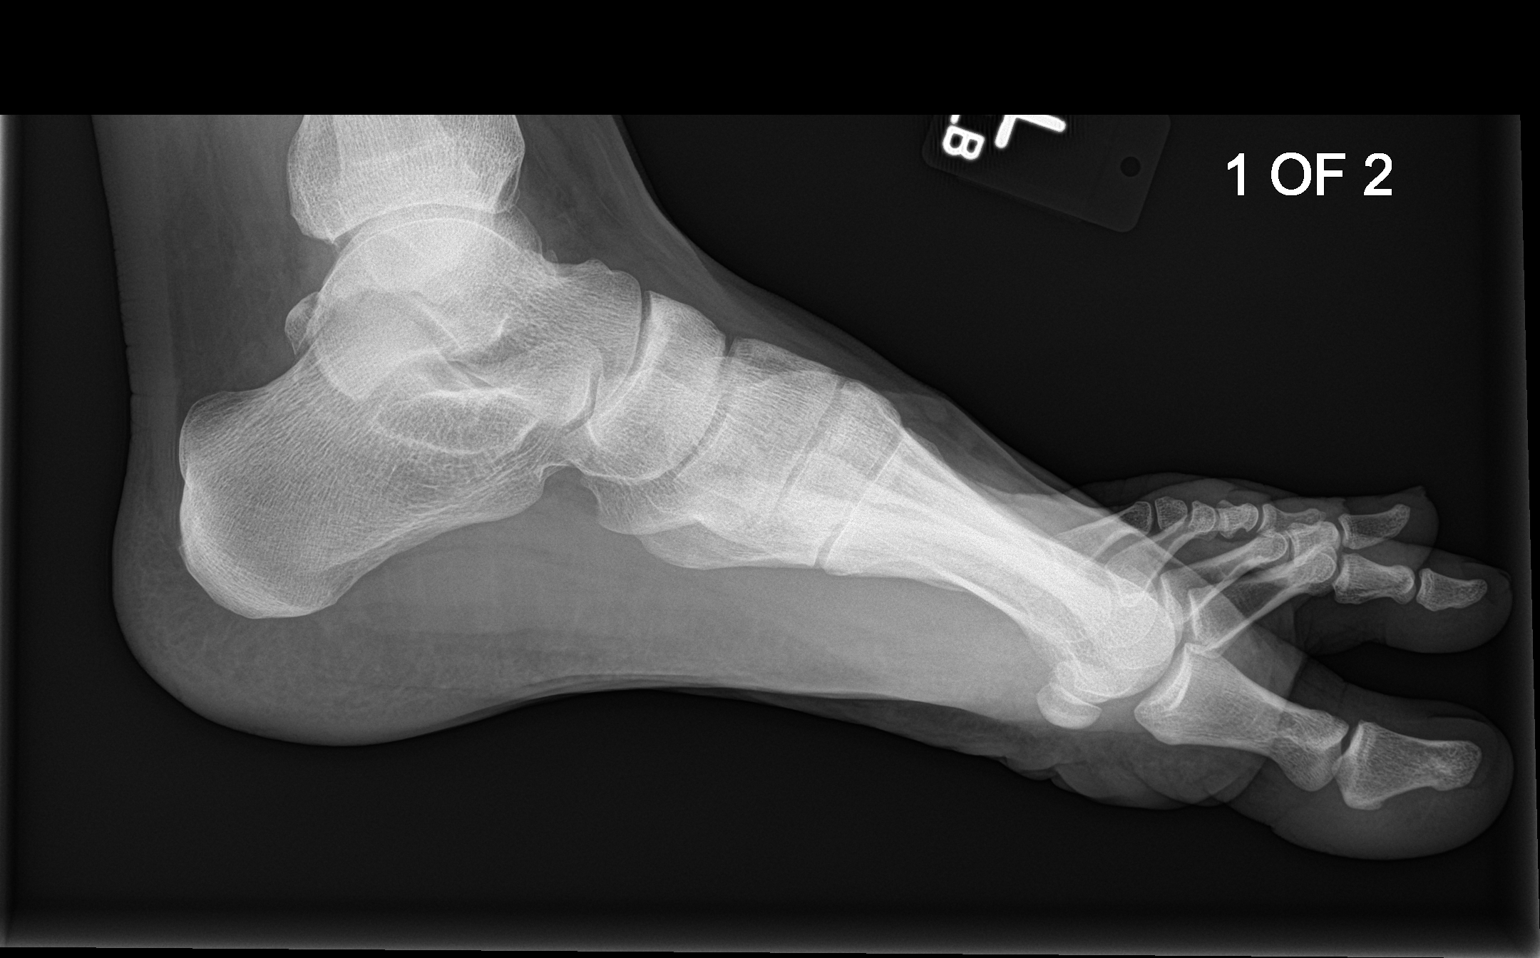

[4 of 4 positions shown; findings below may reference images not displayed]

FINDINGS: There is no evidence of fracture or dislocation. There is no
evidence of arthropathy or other focal bone abnormality. Soft
tissues are unremarkable.
IMPRESSION: Negative.

## 2017-09-05 ENCOUNTER — Other Ambulatory Visit: Payer: Self-pay | Admitting: Internal Medicine

## 2017-09-06 ENCOUNTER — Other Ambulatory Visit: Payer: Self-pay | Admitting: Internal Medicine

## 2017-09-06 DIAGNOSIS — G2581 Restless legs syndrome: Secondary | ICD-10-CM

## 2018-02-02 ENCOUNTER — Encounter: Payer: Self-pay | Admitting: Emergency Medicine

## 2018-02-02 ENCOUNTER — Emergency Department
Admission: EM | Admit: 2018-02-02 | Discharge: 2018-02-02 | Disposition: A | Discharge status: Home / Self Care | Payer: Managed Care, Other (non HMO) | Attending: Student in an Organized Health Care Education/Training Program | Admitting: Student in an Organized Health Care Education/Training Program

## 2018-02-02 ENCOUNTER — Emergency Department: Payer: Managed Care, Other (non HMO)

## 2018-02-02 ENCOUNTER — Other Ambulatory Visit: Payer: Self-pay

## 2018-02-02 DIAGNOSIS — M79605 Pain in left leg: Secondary | ICD-10-CM | POA: Insufficient documentation

## 2018-02-02 DIAGNOSIS — Z87891 Personal history of nicotine dependence: Secondary | ICD-10-CM | POA: Insufficient documentation

## 2018-02-02 DIAGNOSIS — M5442 Lumbago with sciatica, left side: Secondary | ICD-10-CM | POA: Diagnosis not present

## 2018-02-02 DIAGNOSIS — M62830 Muscle spasm of back: Secondary | ICD-10-CM | POA: Diagnosis not present

## 2018-02-02 DIAGNOSIS — J4520 Mild intermittent asthma, uncomplicated: Secondary | ICD-10-CM | POA: Diagnosis not present

## 2018-02-02 DIAGNOSIS — Z79899 Other long term (current) drug therapy: Secondary | ICD-10-CM | POA: Insufficient documentation

## 2018-02-02 DIAGNOSIS — M545 Low back pain: Secondary | ICD-10-CM | POA: Diagnosis present

## 2018-02-02 MED ORDER — KETOROLAC TROMETHAMINE 30 MG/ML IJ SOLN
30.0000 mg | Freq: Once | INTRAMUSCULAR | Status: AC
  Administered 2018-02-02: 30 mg via INTRAMUSCULAR
  Filled 2018-02-02: qty 1

## 2018-02-02 MED ORDER — LIDOCAINE 5 % EX PTCH
1.0000 | MEDICATED_PATCH | CUTANEOUS | Status: DC
  Administered 2018-02-02: 1 via TRANSDERMAL
  Filled 2018-02-02: qty 1

## 2018-02-02 MED ORDER — OXYCODONE-ACETAMINOPHEN 5-325 MG PO TABS
1.0000 | ORAL_TABLET | Freq: Once | ORAL | Status: AC
  Administered 2018-02-02: 1 via ORAL
  Filled 2018-02-02: qty 1

## 2018-02-02 MED ORDER — OXYCODONE-ACETAMINOPHEN 5-325 MG PO TABS: 1.0000 | ORAL_TABLET | Freq: Three times a day (TID) | ORAL | 0 refills | Status: AC | PRN

## 2018-02-02 MED ORDER — CYCLOBENZAPRINE HCL 5 MG PO TABS: ORAL_TABLET | ORAL | 0 refills | Status: DC

## 2018-02-02 MED ORDER — MORPHINE SULFATE (PF) 4 MG/ML IV SOLN
4.0000 mg | Freq: Once | INTRAVENOUS | Status: AC
  Administered 2018-02-02: 4 mg via SUBCUTANEOUS
  Filled 2018-02-02: qty 1

## 2018-02-02 MED ORDER — PREDNISONE 10 MG PO TABS: ORAL_TABLET | ORAL | 0 refills | Status: DC

## 2018-02-02 MED ORDER — KETOROLAC TROMETHAMINE 10 MG PO TABS: 10.0000 mg | ORAL_TABLET | Freq: Four times a day (QID) | ORAL | 0 refills | Status: DC | PRN

## 2018-02-02 MED ORDER — LIDOCAINE 5 % EX PTCH: 1.0000 | MEDICATED_PATCH | CUTANEOUS | 0 refills | Status: DC

## 2018-02-02 NOTE — ED Notes (Signed)
Additional pain meds given   States no relief from prior meds

## 2018-02-02 NOTE — ED Notes (Signed)
States pain is not any better  additional meds given

## 2018-02-02 NOTE — ED Provider Notes (Signed)
[facility] Emergency Department Provider Note  ____________________________________________  Time seen: Approximately 12:07 PM  I have reviewed the triage vital signs and the nursing notes.   HISTORY  Chief Complaint Back Pain    HPI Benjamin Santiago is a 27 y.o. male that presents to  emergency department for evaluation of low back pain for 1 day.  He feels pain in his left leg when he tries to put pressure on that leg the pain is not rating in that leg.  He does not have any pain without pressure.  No specific injury yesterday.  He decided to come to the emergency department after he had difficulty getting out of his car this morning due to pain.  He has no history of back pain.  No bowel or bladder dysfunction or saddle anesthesias.  No urinary symptoms.  No IV drug use.  No fever, chills, nausea, vomiting, abdominal pain.   History reviewed. No pertinent past medical history.  Patient Active Problem List   Diagnosis Date Noted  . Mild intermittent asthma without complication 04/02/2017  . OSA (obstructive sleep apnea) 01/21/2016  . Foot pain, left 06/25/2015  . Restless leg syndrome 06/25/2015  . Gastro-esophageal reflux disease without esophagitis 06/25/2015  . 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 Overweight on examination 05/03/2015  . Arthritis of knee, degenerative 05/03/2015    Past Surgical History:  Procedure Laterality Date  . APPENDECTOMY      Prior to Admission medications   Medication Sig Start Date End Date Taking? Authorizing Provider  albuterol (PROVENTIL HFA;VENTOLIN HFA) 108 (90 Base) MCG/ACT inhaler Inhale 2 puffs into the lungs every 4 (four) hours as needed for wheezing or shortness of breath. 04/02/17   Reubin Milan, MD  cyclobenzaprine (FLEXERIL) 5 MG tablet Take 1-2 tablets 3 times daily as needed 02/02/18   Enid Derry, PA-C  etodolac (LODINE) 500 MG tablet Take 1 tablet (500 mg total) by mouth 2  (two) times daily. 01/23/16   Reubin Milan, MD  HYDROcodone-acetaminophen (NORCO/VICODIN) 5-325 MG tablet Take 1 tablet by mouth every 4 (four) hours as needed for moderate pain. 04/02/17   Reubin Milan, MD  ketorolac (TORADOL) 10 MG tablet Take 1 tablet (10 mg total) by mouth every 6 (six) hours as needed. 02/02/18   Enid Derry, PA-C  lidocaine (LIDODERM) 5 % Place 1 patch onto the skin daily. Remove & Discard patch within 12 hours or as directed by MD 02/02/18   Enid Derry, PA-C  meloxicam (MOBIC) 15 MG tablet Take 1 tablet (15 mg total) by mouth daily. 01/04/17   Cuthriell, Delorise Royals, PA-C  oxyCODONE-acetaminophen (PERCOCET) 5-325 MG tablet Take 1 tablet by mouth every 8 (eight) hours as needed for up to 3 days for severe pain. 02/02/18 02/05/18  Enid Derry, PA-C  pantoprazole (PROTONIX) 40 MG tablet TAKE 1 TABLET BY MOUTH  DAILY 09/06/17   Reubin Milan, MD  phentermine 37.5 MG capsule Take 1 capsule (37.5 mg total) by mouth every morning. 04/02/17   Reubin Milan, MD  predniSONE (DELTASONE) 10 MG tablet Take 6 tablets on day 1, take 5 tablets on day 2, take 4 tablets on day 3, take 3 tablets on day 4, take 2 tablets on day 5, take 1 tablet on day 6 02/02/18   Enid Derry, PA-C  rOPINIRole (REQUIP) 1 MG tablet TAKE 1 TABLET BY MOUTH AT  BEDTIME 09/07/17   Reubin Milan, MD  SUMAtriptan Willette Brace)  100 MG tablet TAKE 1 TABLET EVERY 2 HOURS AS NEEDED FOR MIGRAINE. MAY REPEAT IN 2HRS IF HEADACHE PERSISTS OR RECUR Patient not taking: Reported on 04/02/2017 04/22/16   Reubin MilanBerglund, Laura H, MD  traMADol (ULTRAM) 50 MG tablet Take 1 tablet (50 mg total) by mouth every 12 (twelve) hours as needed. 03/09/16   Reubin MilanBerglund, Laura H, MD  zaleplon (SONATA) 10 MG capsule Take 1 capsule (10 mg total) by mouth at bedtime as needed for sleep. 01/21/16   Reubin MilanBerglund, Laura H, MD    Allergies Patient has no known allergies.  Family History  Problem Relation Age of Onset  . Diabetes Mother   .  Asthma Brother     Social History Social History   Tobacco Use  . Smoking status: Former Smoker    Types: Cigarettes    Last attempt to quit: 10/31/2013    Years since quitting: 4.2  . Smokeless tobacco: Never Used  Substance Use Topics  . Alcohol use: No    Alcohol/week: 0.0 standard drinks  . Drug use: No     Review of Systems  Constitutional: No fever/chills ENT: No upper respiratory complaints. Cardiovascular: No chest pain. Respiratory: No cough. No SOB. Gastrointestinal: No abdominal pain.  No nausea, no vomiting.  Musculoskeletal: Positive for back pain.  Skin: Negative for rash, abrasions, lacerations, ecchymosis. Neurological: Negative for headaches, numbness or tingling   ____________________________________________   PHYSICAL EXAM:  VITAL SIGNS: ED Triage Vitals  Enc Vitals Group     BP 02/02/18 1142 (!) 143/95     Pulse Rate 02/02/18 1142 80     Resp --      Temp 02/02/18 1142 98 F (36.7 C)     Temp Source 02/02/18 1142 Oral     SpO2 02/02/18 1142 95 %     Weight 02/02/18 1143 (!) 315 lb (142.9 kg)     Height 02/02/18 1143 5\' 6"  (1.676 m)     Head Circumference --      Peak Flow --      Pain Score 02/02/18 1133 10     Pain Loc --      Pain Edu? --      Excl. in GC? --      Constitutional: Alert and oriented. Well appearing and in no acute distress. Eyes: Conjunctivae are normal. PERRL. EOMI. Head: Atraumatic. ENT:      Ears:      Nose: No congestion/rhinnorhea.      Mouth/Throat: Mucous membranes are moist.  Neck: No stridor.  Cardiovascular: Normal rate, regular rhythm.  Good peripheral circulation. Respiratory: Normal respiratory effort without tachypnea or retractions. Lungs CTAB. Good air entry to the bases with no decreased or absent breath sounds. Gastrointestinal: Bowel sounds 4 quadrants. Soft and nontender to palpation. No guarding or rigidity. No palpable masses. No distention. No CVA tenderness Musculoskeletal: Full range of  motion to all extremities. No gross deformities appreciated.  Tenderness to palpation over superior lumbar spine and inferior thoracic spine.  Mild left lumbar paraspinal tenderness.  Strength and sensation equal in lower extremity's bilaterally.  No foot drop. Able to raise all toes. Negative straight leg raise.  Antalgic gait.  Weightbearing. Able to transfer from chair to bed.  Neurologic:  Normal speech and language. No gross focal neurologic deficits are appreciated.  Skin:  Skin is warm, dry and intact. No rash noted. Psychiatric: Mood and affect are normal. Speech and behavior are normal. Patient exhibits appropriate insight and judgement.   ____________________________________________  LABS (all labs ordered are listed, but only abnormal results are displayed)  Labs Reviewed - No data to display ____________________________________________  EKG   ____________________________________________  RADIOLOGY Lexine BatonI, Rafaela Dinius, personally viewed and evaluated these images (plain radiographs) as part of my medical decision making, as well as reviewing the written report by the radiologist.  Dg Lumbar Spine Complete  Result Date: 02/02/2018 CLINICAL DATA:  Mid to lower back pain with left lower extremity radiculopathy. No injury. EXAM: LUMBAR SPINE - COMPLETE 4+ VIEW COMPARISON:  CT 06/13/2013 FINDINGS: Vertebral body alignment is within normal. Disc space heights are normal. There is a mild compression fracture of T11 without significant change. Mild facet arthropathy over the lower lumbar spine. No acute compression fracture or spondylolisthesis. IMPRESSION: No acute findings. Stable moderate compression fracture of T11. Electronically Signed   By: Elberta Fortisaniel  Boyle M.D.   On: 02/02/2018 13:22   Ct Lumbar Spine Wo Contrast  Result Date: 02/02/2018 CLINICAL DATA:  Low back pain and left leg pain. EXAM: CT LUMBAR SPINE WITHOUT CONTRAST TECHNIQUE: Multidetector CT imaging of the lumbar spine was  performed without intravenous contrast administration. Multiplanar CT image reconstructions were also generated. COMPARISON:  Radiographs dated 02/02/2018 and CT scan of the abdomen and pelvis dated 06/13/2013 FINDINGS: Segmentation: 5 typical lumbar segments. Alignment: Normal. Vertebrae: Chronic anterior wedge deformity of T11 with anterior osteophytes at the T11-12 disc space. No fracture or bone destruction. Paraspinal and other soft tissues: Negative. Disc levels: T10-11: Negative. T11-12: Slight disc space narrowing with degenerative changes of the vertebral endplates. No disc bulging or protrusion. T12-L1: Negative. L1-2: Negative. L2-3: Negative. L3-4: Negative. L4-5: Negative. L5-S1: Small asymmetric disc bulge to the left of midline without neural impingement. Widely patent neural foramina. IMPRESSION: 1. Small asymmetric disc bulge at L5-S1 to the left of midline without neural impingement. 2. Chronic deformity of T11 with chronic slight disc space narrowing at T11-12. Electronically Signed   By: Francene BoyersJames  Maxwell M.D.   On: 02/02/2018 15:27    ____________________________________________    PROCEDURES  Procedure(s) performed:    Procedures    Medications  lidocaine (LIDODERM) 5 % 1 patch (1 patch Transdermal Patch Applied 02/02/18 1357)  oxyCODONE-acetaminophen (PERCOCET/ROXICET) 5-325 MG per tablet 1 tablet (1 tablet Oral Given 02/02/18 1218)  ketorolac (TORADOL) 30 MG/ML injection 30 mg (30 mg Intramuscular Given 02/02/18 1357)  oxyCODONE-acetaminophen (PERCOCET/ROXICET) 5-325 MG per tablet 1 tablet (1 tablet Oral Given 02/02/18 1357)  morphine 4 MG/ML injection 4 mg (4 mg Subcutaneous Given 02/02/18 1628)     ____________________________________________   INITIAL IMPRESSION / ASSESSMENT AND PLAN / ED COURSE  Pertinent labs & imaging results that were available during my care of the patient were reviewed by me and considered in my medical decision making (see chart for  details).  Review of the [place] CSRS was performed in accordance of the [facility] prior to dispensing any controlled drugs.  Patient presented to the emergency department for evaluation of low back pain for 1 day.  Vital signs and exam are reassuring.   X-ray consistent with chronic compression fracture of T11.  Pain was unrelieved by Percocet and Toradol in the ED.  CT scan was ordered to further evaluate compression fracture.  CT scan consistent with chronic compression fracture and small disc bulge. No neurological symptoms. Pain does not radiate. Pain medication regimen was discussed with Dr. Roxan Hockeyobinson. Patient was then given IM morphine with some relief. Patient will be discharged home with prescriptions for prednisone, flexeril, toradol, lidoderm, a  short course of percocet. Patient is to follow up with PCP as directed. Patient is given ED precautions to return to the ED for any worsening or new symptoms.     ____________________________________________  FINAL CLINICAL IMPRESSION(S) / ED DIAGNOSES  Final diagnoses:  Acute midline low back pain with left-sided sciatica  Muscle spasm of back      NEW MEDICATIONS STARTED DURING THIS VISIT:  ED Discharge Orders         Ordered    oxyCODONE-acetaminophen (PERCOCET) 5-325 MG tablet  Every 8 hours PRN     02/02/18 1642    ketorolac (TORADOL) 10 MG tablet  Every 6 hours PRN     02/02/18 1642    cyclobenzaprine (FLEXERIL) 5 MG tablet     02/02/18 1642    lidocaine (LIDODERM) 5 %  Every 24 hours     02/02/18 1642    predniSONE (DELTASONE) 10 MG tablet     02/02/18 1657              This chart was dictated using voice recognition software/Dragon. Despite best efforts to proofread, errors can occur which can change the meaning. Any change was purely unintentional.    Enid Derry, PA-C 02/02/18 1854    Willy Eddy, MD 02/02/18 [phone]

## 2018-02-02 NOTE — ED Notes (Signed)
Back from CT  Family remains in room

## 2018-02-02 NOTE — ED Triage Notes (Signed)
Presents with lower back pain which is moving into left leg  Denies any injury

## 2018-02-09 ENCOUNTER — Other Ambulatory Visit: Payer: Self-pay | Admitting: Internal Medicine

## 2018-02-10 ENCOUNTER — Other Ambulatory Visit: Payer: Self-pay | Admitting: Internal Medicine

## 2018-02-10 DIAGNOSIS — G2581 Restless legs syndrome: Secondary | ICD-10-CM

## 2019-01-04 LAB — LIPID PANEL
Cholesterol: 166 (ref 0–200)
HDL: 36 (ref 35–70)
LDL Cholesterol: 83
Triglycerides: 236 — AB (ref 40–160)

## 2019-01-04 LAB — HEMOGLOBIN A1C: Hemoglobin A1C: 4.7

## 2019-01-04 LAB — BASIC METABOLIC PANEL
Creatinine: 1.1 (ref 0.6–1.3)
Glucose: 77

## 2019-02-06 ENCOUNTER — Other Ambulatory Visit: Payer: Self-pay

## 2019-02-06 ENCOUNTER — Ambulatory Visit: Payer: Managed Care, Other (non HMO) | Admitting: Internal Medicine

## 2019-02-06 ENCOUNTER — Encounter: Payer: Self-pay | Admitting: Internal Medicine

## 2019-02-06 VITALS — BP 126/76 | HR 104 | Ht 66.0 in | Wt 273.0 lb

## 2019-02-06 DIAGNOSIS — Z6841 Body Mass Index (BMI) 40.0 and over, adult: Secondary | ICD-10-CM

## 2019-02-06 DIAGNOSIS — K219 Gastro-esophageal reflux disease without esophagitis: Secondary | ICD-10-CM

## 2019-02-06 DIAGNOSIS — G43009 Migraine without aura, not intractable, without status migrainosus: Secondary | ICD-10-CM | POA: Diagnosis not present

## 2019-02-06 DIAGNOSIS — J4520 Mild intermittent asthma, uncomplicated: Secondary | ICD-10-CM

## 2019-02-06 DIAGNOSIS — G2581 Restless legs syndrome: Secondary | ICD-10-CM

## 2019-02-06 MED ORDER — ROPINIROLE HCL 1 MG PO TABS: 1.0000 mg | ORAL_TABLET | Freq: Every day | ORAL | 5 refills | Status: DC

## 2019-02-06 MED ORDER — ALBUTEROL SULFATE HFA 108 (90 BASE) MCG/ACT IN AERS: 2.0000 | INHALATION_SPRAY | RESPIRATORY_TRACT | 5 refills | Status: AC | PRN

## 2019-02-06 MED ORDER — PHENTERMINE HCL 37.5 MG PO TABS: 37.5000 mg | ORAL_TABLET | Freq: Every day | ORAL | 1 refills | Status: DC

## 2019-02-06 MED ORDER — PANTOPRAZOLE SODIUM 40 MG PO TBEC: 40.0000 mg | DELAYED_RELEASE_TABLET | Freq: Every day | ORAL | 1 refills | Status: DC

## 2019-02-06 MED ORDER — SUMATRIPTAN SUCCINATE 100 MG PO TABS: ORAL_TABLET | ORAL | 5 refills | Status: AC

## 2019-02-06 NOTE — Progress Notes (Signed)
Date:  02/06/2019   Name:  Benjamin Santiago   DOB:  February 07, 1991   MRN:  [id number]   Chief Complaint: Weight Loss (Started new diet April 20th- Taking Phentermine consistantly every day since. Before was prescribed and did not do it right. Working on getting healthier. Wants to know if he can get a RF of the tablet and not capsule. ), Restless Leg Syndrome (Need RF on ropinirole- but wants to know if the dose can be increased. Restless legs are gettring worse.  ), Gastroesophageal Reflux (Pantoprazole RF.), Migraine (Wants to know if he can get a doctors note for his windows to be tinted past legal limit. The sun causes his migraines to be much worse. ), and Allergic Rhinitis  (RF on inhaler. Seasonal allergies and seaon changes cause asthma flare ups.)  Asthma He complains of chest tightness, cough and wheezing. There is no shortness of breath. This is a recurrent problem. The problem occurs intermittently (usually when working outside [place]). The problem has been unchanged. The cough is non-productive. Associated symptoms include heartburn and myalgias. Pertinent negatives include no appetite change, chest pain or headaches. His symptoms are alleviated by beta-agonist. He reports significant improvement on treatment. His past medical history is significant for asthma.  Gastroesophageal Reflux He complains of coughing, heartburn, water brash and wheezing. He reports no abdominal pain or no chest pain. This is a recurrent problem. The problem occurs occasionally. Pertinent negatives include no fatigue. Risk factors include obesity. He has tried a PPI for the symptoms. The treatment provided significant relief.  Migraine  This is a recurrent problem. The problem occurs intermittently (several times a week). The problem has been unchanged. The pain does not radiate. The pain is moderate. Associated symptoms include coughing and photophobia. Pertinent negatives include no abdominal pain or numbness. The  symptoms are aggravated by bright light and activity. He has tried triptans (He is not interested in monthly prevenative therapy with calcitonin gene receptor blockers because they are administered by injection) for the symptoms. The treatment provided significant relief.   Restless leg syndrome - On requip 1 mg at bedtime.  Last refill for 90 tabs one year go.  Even when he had it, it was not completely helpful.  He would like to get a higher dose.  Obesity - was seen one year go and given Phentermine.  He says that he did not take it as prescribed and he did not follow up.  Now he is taking it regularly and losing weight.  He has been on phentermine for the past 4 months.  He is eating a Herb-a-life shake in the morning, fasting until dinner.  Lab Results  Component Value Date   CREATININE 1.1 01/04/2019   BUN 13 06/13/2013   NA 137 06/13/2013   K 3.5 06/13/2013   CL 105 06/13/2013   CO2 24 06/13/2013   Lab Results  Component Value Date   HGBA1C 4.7 01/04/2019   Lab Results  Component Value Date   CHOL 166 01/04/2019   HDL 36 01/04/2019   LDLCALC 83 01/04/2019   TRIG 236 (A) 01/04/2019    Review of Systems  Constitutional: Negative for appetite change, fatigue and unexpected weight change.  Eyes: Positive for photophobia. Negative for visual disturbance.  Respiratory: Positive for cough and wheezing. Negative for shortness of breath.   Cardiovascular: Negative for chest pain, palpitations and leg swelling.  Gastrointestinal: Positive for heartburn. Negative for abdominal pain and blood in stool.  Endocrine:  Negative for polydipsia and polyuria.  Genitourinary: Negative for dysuria and hematuria.  Musculoskeletal: Positive for myalgias.  Skin: Negative for color change and rash.  Neurological: Negative for tremors, numbness and headaches.  Psychiatric/Behavioral: Negative for dysphoric mood.    Patient Active Problem List   Diagnosis Date Noted  . Mild intermittent asthma  without complication 04/02/2017  . Migraine without aura and without status migrainosus, not intractable 03/25/2016  . OSA (obstructive sleep apnea) 01/21/2016  . Foot pain, left 06/25/2015  . Restless leg syndrome 06/25/2015  . Gastro-esophageal reflux disease without esophagitis 06/25/2015  . 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 Overweight on examination 05/03/2015  . Arthritis of knee, degenerative 05/03/2015    No Known Allergies  Past Surgical History:  Procedure Laterality Date  . APPENDECTOMY      Social History   Tobacco Use  . Smoking status: Former Smoker    Types: Cigarettes    Quit date: 10/31/2013    Years since quitting: 5.2  . Smokeless tobacco: Never Used  Substance Use Topics  . Alcohol use: No    Alcohol/week: 0.0 standard drinks  . Drug use: No     Medication list has been reviewed and updated.  Current Meds  Medication Sig  . albuterol (PROVENTIL HFA;VENTOLIN HFA) 108 (90 Base) MCG/ACT inhaler Inhale 2 puffs into the lungs every 4 (four) hours as needed for wheezing or shortness of breath.  . pantoprazole (PROTONIX) 40 MG tablet TAKE 1 TABLET BY MOUTH  DAILY  . rOPINIRole (REQUIP) 1 MG tablet TAKE 1 TABLET BY MOUTH AT  BEDTIME  . SUMAtriptan (IMITREX) 100 MG tablet TAKE 1 TABLET EVERY 2 HOURS AS NEEDED FOR MIGRAINE. MAY REPEAT IN 2HRS IF HEADACHE PERSISTS OR RECUR    PHQ 2/9 Scores 02/06/2019 04/02/2017 10/30/2015  PHQ - 2 Score 0 0 0    BP Readings from Last 3 Encounters:  02/06/19 126/76  02/02/18 104/75  04/02/17 122/64    Physical Exam Vitals signs and nursing note reviewed.  Constitutional:      General: He is not in acute distress.    Appearance: Normal appearance. He is well-developed.  HENT:     Head: Normocephalic and atraumatic.  Neck:     Musculoskeletal: Normal range of motion.  Cardiovascular:     Rate and Rhythm: Normal rate and regular rhythm.     Pulses: Normal pulses.     Heart sounds: No murmur.   Pulmonary:     Effort: Pulmonary effort is normal. No respiratory distress.     Breath sounds: No wheezing or rhonchi.  Abdominal:     General: Abdomen is flat.     Palpations: Abdomen is soft.     Tenderness: There is no abdominal tenderness.  Musculoskeletal: Normal range of motion.     Right lower leg: No edema.     Left lower leg: No edema.  Lymphadenopathy:     Cervical: No cervical adenopathy.  Skin:    General: Skin is warm and dry.     Capillary Refill: Capillary refill takes less than 2 seconds.     Findings: No rash.  Neurological:     General: No focal deficit present.     Mental Status: He is alert and oriented to person, place, and time.  Psychiatric:        Attention and Perception: Attention normal.        Mood and Affect: Mood normal.        Behavior:  Behavior normal.        Thought Content: Thought content normal.     Wt Readings from Last 3 Encounters:  02/06/19 273 lb (123.8 kg)  02/02/18 (!) 315 lb (142.9 kg)  04/02/17 281 lb (127.5 kg)    BP 126/76   Pulse (!) 104   Ht 5\' 6"  (1.676 m)   Wt 273 lb (123.8 kg)   SpO2 93%   BMI 44.06 kg/m   Assessment and Plan: 1. Mild intermittent asthma without complication Stable symptoms Continue albuterol PRN when exposed to environmental allergens - albuterol (VENTOLIN HFA) 108 (90 Base) MCG/ACT inhaler; Inhale 2 puffs into the lungs every 4 (four) hours as needed for wheezing or shortness of breath.  Dispense: 18 g; Refill: 5  2. Restless leg syndrome Begin 1 mg nightly, titrate up by 1 mg every 4-5 days to max of 3 mg at HS - rOPINIRole (REQUIP) 1 MG tablet; Take 1-3 tablets (1-3 mg total) by mouth at bedtime.  Dispense: 90 tablet; Refill: 5  3. Migraine without aura and without status migrainosus, not intractable He has several migraines per week but does not want to try a preventative - SUMAtriptan (IMITREX) 100 MG tablet; TAKE 1 TABLET EVERY 2 HOURS AS NEEDED FOR MIGRAINE. MAY REPEAT IN 2HRS IF  HEADACHE PERSISTS OR RECUR  Dispense: 10 tablet; Refill: 5  4. Gastro-esophageal reflux disease without esophagitis Symptoms well controlled on daily PPI No red flag signs such as weight loss, n/v, melena Will continue Pantoprazole daily. - pantoprazole (PROTONIX) 40 MG tablet; Take 1 tablet (40 mg total) by mouth daily.  Dispense: 90 tablet; Refill: 1  5. BMI 40.0-44.9, adult (HCC) Will give him 2 more months of treatment for a max duration of therapy of 6 months - phentermine (ADIPEX-P) 37.5 MG tablet; Take 1 tablet (37.5 mg total) by mouth daily before breakfast.  Dispense: 30 tablet; Refill: 1   Partially dictated using Animal nutritionistDragon software. Any errors are unintentional.  Bari EdwardLaura Alwyn Cordner, MD [facility]  02/06/2019

## 2019-09-01 ENCOUNTER — Encounter: Payer: Self-pay | Admitting: Internal Medicine

## 2019-09-01 ENCOUNTER — Other Ambulatory Visit: Payer: Self-pay

## 2019-09-01 ENCOUNTER — Ambulatory Visit: Payer: Managed Care, Other (non HMO) | Admitting: Internal Medicine

## 2019-09-01 VITALS — BP 136/64 | HR 104 | Temp 97.9°F | Ht 66.0 in | Wt 289.0 lb

## 2019-09-01 DIAGNOSIS — K219 Gastro-esophageal reflux disease without esophagitis: Secondary | ICD-10-CM

## 2019-09-01 DIAGNOSIS — Z23 Encounter for immunization: Secondary | ICD-10-CM | POA: Diagnosis not present

## 2019-09-01 DIAGNOSIS — G4733 Obstructive sleep apnea (adult) (pediatric): Secondary | ICD-10-CM | POA: Diagnosis not present

## 2019-09-01 DIAGNOSIS — Z6841 Body Mass Index (BMI) 40.0 and over, adult: Secondary | ICD-10-CM | POA: Diagnosis not present

## 2019-09-01 DIAGNOSIS — G2581 Restless legs syndrome: Secondary | ICD-10-CM | POA: Diagnosis not present

## 2019-09-01 MED ORDER — ROPINIROLE HCL 1 MG PO TABS: 1.0000 mg | ORAL_TABLET | Freq: Every day | ORAL | 5 refills | Status: DC

## 2019-09-01 MED ORDER — PANTOPRAZOLE SODIUM 40 MG PO TBEC: 40.0000 mg | DELAYED_RELEASE_TABLET | Freq: Every day | ORAL | 1 refills | Status: AC

## 2019-09-01 MED ORDER — PHENTERMINE HCL 37.5 MG PO TABS: 37.5000 mg | ORAL_TABLET | Freq: Every day | ORAL | 2 refills | Status: DC

## 2019-09-01 NOTE — Progress Notes (Signed)
Date:  09/01/2019   Name:  Benjamin Santiago   DOB:  12-22-1990   MRN:  [id number]   Chief Complaint: No chief complaint on file.  Gastroesophageal Reflux He complains of heartburn. He reports no abdominal pain, no chest pain or no wheezing. This is a recurrent problem. The problem occurs rarely. The heartburn duration is less than a minute. The heartburn does not wake him from sleep. The heartburn does not limit his activity. The heartburn doesn't change with position. Pertinent negatives include no fatigue. Risk factors include obesity. He has tried a PPI for the symptoms. The treatment provided significant relief.  RLS - on requip nightly and doing well.  He also has OSA but can not stand the machine so his apnea is untreated. Weight - he has used phentermine off and on for the past few years.  He has been able to lose weight on the medication but then gains back some of what was lost when he stops it.  He would like to be able to continue it until he gets to his goal weight of 230 lbs.   Lab Results  Component Value Date   CREATININE 1.1 01/04/2019   BUN 13 06/13/2013   NA 137 06/13/2013   K 3.5 06/13/2013   CL 105 06/13/2013   CO2 24 06/13/2013   Lab Results  Component Value Date   CHOL 166 01/04/2019   HDL 36 01/04/2019   LDLCALC 83 01/04/2019   TRIG 236 (A) 01/04/2019   No results found for: TSH Lab Results  Component Value Date   HGBA1C 4.7 01/04/2019   Lab Results  Component Value Date   WBC 18.6 (H) 06/13/2013   HGB 16.8 06/13/2013   HCT 47.5 06/13/2013   MCV 87 06/13/2013   PLT 284 06/13/2013   Lab Results  Component Value Date   ALT 24 06/13/2013   AST 20 06/13/2013   ALKPHOS 88 06/13/2013   BILITOT 0.8 06/13/2013     Review of Systems  Constitutional: Negative for chills, fatigue and fever.  Respiratory: Negative for chest tightness, shortness of breath and wheezing.   Cardiovascular: Negative for chest pain.  Gastrointestinal: Positive for  heartburn. Negative for abdominal pain and constipation.  Neurological: Negative for dizziness and headaches.  Psychiatric/Behavioral: Negative for dysphoric mood and sleep disturbance. The patient is not nervous/anxious.     Patient Active Problem List   Diagnosis Date Noted  . Mild intermittent asthma without complication 41/93/7902  . Migraine without aura and without status migrainosus, not intractable 03/25/2016  . OSA (obstructive sleep apnea) 01/21/2016  . Foot pain, left 06/25/2015  . Restless leg syndrome 06/25/2015  . Gastro-esophageal reflux disease without esophagitis 06/25/2015  . 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 BMI 40.0-44.9, adult ([place]) 05/03/2015  . Arthritis of knee, degenerative 05/03/2015    No Known Allergies  Past Surgical History:  Procedure Laterality Date  . APPENDECTOMY      Social History   Tobacco Use  . Smoking status: Former Smoker    Types: Cigarettes    Quit date: 10/31/2013    Years since quitting: 5.8  . Smokeless tobacco: Never Used  Substance Use Topics  . Alcohol use: No    Alcohol/week: 0.0 standard drinks  . Drug use: No     Medication list has been reviewed and updated.  Current Meds  Medication Sig  . albuterol (VENTOLIN HFA) 108 (90 Base) MCG/ACT inhaler Inhale 2 puffs into the  lungs every 4 (four) hours as needed for wheezing or shortness of breath.  . pantoprazole (PROTONIX) 40 MG tablet Take 1 tablet (40 mg total) by mouth daily.  Marland Kitchen rOPINIRole (REQUIP) 1 MG tablet Take 1-3 tablets (1-3 mg total) by mouth at bedtime.  . SUMAtriptan (IMITREX) 100 MG tablet TAKE 1 TABLET EVERY 2 HOURS AS NEEDED FOR MIGRAINE. MAY REPEAT IN 2HRS IF HEADACHE PERSISTS OR RECUR    PHQ 2/9 Scores 09/01/2019 02/06/2019 04/02/2017 10/30/2015  PHQ - 2 Score 0 0 0 0  PHQ- 9 Score 0 - - -    BP Readings from Last 3 Encounters:  09/01/19 136/64  02/06/19 126/76  02/02/18 104/75    Physical Exam Vitals and nursing note  reviewed.  Constitutional:      General: He is not in acute distress.    Appearance: He is well-developed.  HENT:     Head: Normocephalic and atraumatic.  Cardiovascular:     Rate and Rhythm: Normal rate and regular rhythm.     Pulses: Normal pulses.  Pulmonary:     Effort: Pulmonary effort is normal. No respiratory distress.     Breath sounds: No wheezing or rhonchi.  Musculoskeletal:     Cervical back: Normal range of motion.     Right lower leg: No edema.     Left lower leg: No edema.  Lymphadenopathy:     Cervical: No cervical adenopathy.  Skin:    General: Skin is warm and dry.     Findings: No rash.  Neurological:     General: No focal deficit present.     Mental Status: He is alert and oriented to person, place, and time.  Psychiatric:        Behavior: Behavior normal.        Thought Content: Thought content normal.     Wt Readings from Last 3 Encounters:  09/01/19 289 lb (131.1 kg)  02/06/19 273 lb (123.8 kg)  02/02/18 (!) 315 lb (142.9 kg)    BP 136/64   Pulse (!) 104   Temp 97.9 F (36.6 C) (Oral)   Ht 5\' 6"  (1.676 m)   Wt 289 lb (131.1 kg)   SpO2 97%   BMI 46.65 kg/m   Assessment and Plan: 1. BMI 45.0-49.9, adult (HCC) Will resume phentermine with close monitoring Continue healthy diet; exercixe - Comprehensive metabolic panel - Hemoglobin A1c - Lipid panel - phentermine (ADIPEX-P) 37.5 MG tablet; Take 1 tablet (37.5 mg total) by mouth daily before breakfast.  Dispense: 30 tablet; Refill: 2  2. Gastro-esophageal reflux disease without esophagitis Symptoms well controlled on daily PPI No red flag signs such as weight loss, n/v, melena Will continue pantoprazole. - CBC with Differential/Platelet - pantoprazole (PROTONIX) 40 MG tablet; Take 1 tablet (40 mg total) by mouth daily.  Dispense: 90 tablet; Refill: 1  3. Restless leg syndrome Doing well on current therapy - CBC with Differential/Platelet - TSH + free T4 - rOPINIRole (REQUIP) 1 MG  tablet; Take 1-3 tablets (1-3 mg total) by mouth at bedtime.  Dispense: 90 tablet; Refill: 5  4. OSA (obstructive sleep apnea) Intolerant of the auto cpap so not using  5. Need for influenza vaccination - Flu Vaccine QUAD 36+ mos IM   Partially dictated using 11-08-1983. Any errors are unintentional.  Animal nutritionist, MD [facility]  09/01/2019

## 2019-09-02 LAB — COMPREHENSIVE METABOLIC PANEL
ALT: 23 IU/L (ref 0–44)
AST: 19 IU/L (ref 0–40)
Albumin/Globulin Ratio: 1.8 (ref 1.2–2.2)
Albumin: 4.8 g/dL (ref 4.1–5.2)
Alkaline Phosphatase: 78 IU/L (ref 39–117)
BUN/Creatinine Ratio: 17 (ref 9–20)
BUN: 17 mg/dL (ref 6–20)
Bilirubin Total: 0.4 mg/dL (ref 0.0–1.2)
CO2: 23 mmol/L (ref 20–29)
Calcium: 9.8 mg/dL (ref 8.7–10.2)
Chloride: 101 mmol/L (ref 96–106)
Creatinine, Ser: 1.01 mg/dL (ref 0.76–1.27)
GFR calc Af Amer: 115 mL/min/{1.73_m2} (ref >59)
GFR calc non Af Amer: 99 mL/min/{1.73_m2} (ref >59)
Globulin, Total: 2.6 g/dL (ref 1.5–4.5)
Glucose: 81 mg/dL (ref 65–99)
Potassium: 4.2 mmol/L (ref 3.5–5.2)
Sodium: 139 mmol/L (ref 134–144)
Total Protein: 7.4 g/dL (ref 6.0–8.5)

## 2019-09-02 LAB — CBC WITH DIFFERENTIAL/PLATELET
Basophils Absolute: 0.1 10*3/uL (ref 0.0–0.2)
Basos: 1 %
EOS (ABSOLUTE): 0.1 10*3/uL (ref 0.0–0.4)
Eos: 2 %
Hematocrit: 46.1 % (ref 37.5–51.0)
Hemoglobin: 16 g/dL (ref 13.0–17.7)
Immature Grans (Abs): 0 10*3/uL (ref 0.0–0.1)
Immature Granulocytes: 0 %
Lymphocytes Absolute: 1.9 10*3/uL (ref 0.7–3.1)
Lymphs: 24 %
MCH: 30.7 pg (ref 26.6–33.0)
MCHC: 34.7 g/dL (ref 31.5–35.7)
MCV: 88 fL (ref 79–97)
Monocytes Absolute: 0.7 10*3/uL (ref 0.1–0.9)
Monocytes: 8 %
Neutrophils Absolute: 5.2 10*3/uL (ref 1.4–7.0)
Neutrophils: 65 %
Platelets: 243 10*3/uL (ref 150–450)
RBC: 5.22 x10E6/uL (ref 4.14–5.80)
RDW: 13 % (ref 11.6–15.4)
WBC: 8 10*3/uL (ref 3.4–10.8)

## 2019-09-02 LAB — LIPID PANEL
Chol/HDL Ratio: 6.3 ratio — ABNORMAL HIGH (ref 0.0–5.0)
Cholesterol, Total: 201 mg/dL — ABNORMAL HIGH (ref 100–199)
HDL: 32 mg/dL — ABNORMAL LOW (ref >39)
LDL Chol Calc (NIH): 102 mg/dL — ABNORMAL HIGH (ref 0–99)
Triglycerides: 393 mg/dL — ABNORMAL HIGH (ref 0–149)
VLDL Cholesterol Cal: 67 mg/dL — ABNORMAL HIGH (ref 5–40)

## 2019-09-02 LAB — TSH+FREE T4
Free T4: 1.04 ng/dL (ref 0.82–1.77)
TSH: 2.46 u[IU]/mL (ref 0.450–4.500)

## 2019-09-02 LAB — HEMOGLOBIN A1C
Est. average glucose Bld gHb Est-mCnc: 94 mg/dL
Hgb A1c MFr Bld: 4.9 % (ref 4.8–5.6)

## 2019-10-16 ENCOUNTER — Emergency Department
Admission: EM | Admit: 2019-10-16 | Discharge: 2019-10-16 | Discharge status: Against Medical Advice | Payer: Managed Care, Other (non HMO)

## 2019-11-30 ENCOUNTER — Ambulatory Visit: Payer: Managed Care, Other (non HMO) | Admitting: Internal Medicine

## 2019-11-30 ENCOUNTER — Other Ambulatory Visit: Payer: Self-pay

## 2019-11-30 ENCOUNTER — Encounter: Payer: Self-pay | Admitting: Internal Medicine

## 2019-11-30 VITALS — BP 134/86 | HR 93 | Temp 98.3°F | Ht 66.0 in | Wt 267.0 lb

## 2019-11-30 DIAGNOSIS — M25571 Pain in right ankle and joints of right foot: Secondary | ICD-10-CM

## 2019-11-30 DIAGNOSIS — Z6841 Body Mass Index (BMI) 40.0 and over, adult: Secondary | ICD-10-CM | POA: Diagnosis not present

## 2019-11-30 NOTE — Progress Notes (Signed)
Date:  11/30/2019   Name:  Benjamin Santiago   DOB:  03/30/1991   MRN:  [id number]   Chief Complaint: Weight Check (med RF/) and Ankle Injury (rolled right ankle X2 weeks ago not getting better ,swollen ankle, painful, constent pain, uncomfortable, shooting pain sometimes  )  Ankle Injury  The incident occurred more than 1 week ago. The incident occurred at home. The injury mechanism was an eversion injury. The pain is present in the right ankle. The pain is mild (and instability with persistent swelling).  Weight check - he started Phentermine in March.  He has done well, eating smaller portions and drinking less alcohol.  He also went to the weight loss clinic in [place] and was started on Topamax.  He denies side effects to the medication.  He will follow up with them monthly and get his prescriptions from them.  Lab Results  Component Value Date   CREATININE 1.01 09/01/2019   BUN 17 09/01/2019   NA 139 09/01/2019   K 4.2 09/01/2019   CL 101 09/01/2019   CO2 23 09/01/2019   Lab Results  Component Value Date   CHOL 201 (H) 09/01/2019   HDL 32 (L) 09/01/2019   LDLCALC 102 (H) 09/01/2019   TRIG 393 (H) 09/01/2019   CHOLHDL 6.3 (H) 09/01/2019   Lab Results  Component Value Date   TSH 2.460 09/01/2019   Lab Results  Component Value Date   HGBA1C 4.9 09/01/2019   Lab Results  Component Value Date   WBC 8.0 09/01/2019   HGB 16.0 09/01/2019   HCT 46.1 09/01/2019   MCV 88 09/01/2019   PLT 243 09/01/2019   Lab Results  Component Value Date   ALT 23 09/01/2019   AST 19 09/01/2019   ALKPHOS 78 09/01/2019   BILITOT 0.4 09/01/2019     Review of Systems  Constitutional: Negative for chills, diaphoresis, fever and unexpected weight change.  Respiratory: Negative for chest tightness and shortness of breath.   Cardiovascular: Negative for chest pain, palpitations and leg swelling.  Musculoskeletal: Positive for arthralgias (right ankle), gait problem and joint swelling.    Neurological: Negative for dizziness, light-headedness and headaches.    Patient Active Problem List   Diagnosis Date Noted  . Mild intermittent asthma without complication 98/33/8250  . Migraine without aura and without status migrainosus, not intractable 03/25/2016  . OSA (obstructive sleep apnea) 01/21/2016  . Foot pain, left 06/25/2015  . Restless leg syndrome 06/25/2015  . Gastro-esophageal reflux disease without esophagitis 06/25/2015  . 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 BMI 45.0-49.9, adult ([place]) 05/03/2015  . Arthritis of knee, degenerative 05/03/2015    No Known Allergies  Past Surgical History:  Procedure Laterality Date  . APPENDECTOMY      Social History   Tobacco Use  . Smoking status: Former Smoker    Types: Cigarettes    Quit date: 10/31/2013    Years since quitting: 6.0  . Smokeless tobacco: Never Used  Substance Use Topics  . Alcohol use: No    Alcohol/week: 0.0 standard drinks  . Drug use: No     Medication list has been reviewed and updated.  Current Meds  Medication Sig  . albuterol (VENTOLIN HFA) 108 (90 Base) MCG/ACT inhaler Inhale 2 puffs into the lungs every 4 (four) hours as needed for wheezing or shortness of breath.  . pantoprazole (PROTONIX) 40 MG tablet Take 1 tablet (40 mg total) by mouth daily.  Marland Kitchen  phentermine (ADIPEX-P) 37.5 MG tablet Take 1 tablet (37.5 mg total) by mouth daily before breakfast.  . rOPINIRole (REQUIP) 1 MG tablet Take 1-3 tablets (1-3 mg total) by mouth at bedtime.  . SUMAtriptan (IMITREX) 100 MG tablet TAKE 1 TABLET EVERY 2 HOURS AS NEEDED FOR MIGRAINE. MAY REPEAT IN 2HRS IF HEADACHE PERSISTS OR RECUR  . topiramate (TOPAMAX) 50 MG tablet Take 50 mg by mouth daily.    PHQ 2/9 Scores 11/30/2019 09/01/2019 02/06/2019 04/02/2017  PHQ - 2 Score 0 0 0 0  PHQ- 9 Score 0 0 - -    GAD 7 : Generalized Anxiety Score 11/30/2019  Nervous, Anxious, on Edge 0  Control/stop worrying 0  Worry too much -  different things 0  Trouble relaxing 0  Restless 0  Easily annoyed or irritable 0  Afraid - awful might happen 0  Total GAD 7 Score 0  Anxiety Difficulty Not difficult at all    BP Readings from Last 3 Encounters:  11/30/19 134/86  09/01/19 136/64  02/06/19 126/76    Physical Exam Vitals and nursing note reviewed.  Constitutional:      General: He is not in acute distress.    Appearance: He is well-developed.  HENT:     Head: Normocephalic and atraumatic.  Cardiovascular:     Rate and Rhythm: Normal rate and regular rhythm.     Pulses: Normal pulses.     Heart sounds: No murmur heard.   Pulmonary:     Effort: Pulmonary effort is normal. No respiratory distress.     Breath sounds: No wheezing or rhonchi.  Musculoskeletal:     Cervical back: Normal range of motion.     Right ankle: Swelling present. No deformity. No tenderness. Anterior drawer test negative.     Left ankle: Normal.  Lymphadenopathy:     Cervical: No cervical adenopathy.  Skin:    General: Skin is warm and dry.     Findings: No rash.  Neurological:     Mental Status: He is alert and oriented to person, place, and time.  Psychiatric:        Behavior: Behavior normal.        Thought Content: Thought content normal.     Wt Readings from Last 3 Encounters:  11/30/19 267 lb (121.1 kg)  09/01/19 289 lb (131.1 kg)  02/06/19 273 lb (123.8 kg)    BP 134/86   Pulse 93   Temp 98.3 F (36.8 C) (Oral)   Ht 5\' 6"  (1.676 m)   Wt 267 lb (121.1 kg)   SpO2 98%   BMI 43.09 kg/m   Assessment and Plan: 1. BMI 45.0-49.9, adult [facility]) Doing well on Phentermine and now topamax He will follow up with weight management in [place]  2. Acute right ankle pain Concern for occult fracture or tendon disruption Recommend wraps and xray - he can not do today but will try for the next week May need Ortho referral if no improvement - DG Ankle Complete Right; Future   Partially dictated using Dragon software. Any  errors are unintentional.  June, MD [facility]  11/30/2019

## 2019-12-01 ENCOUNTER — Ambulatory Visit: Payer: Managed Care, Other (non HMO)

## 2019-12-01 ENCOUNTER — Ambulatory Visit: Payer: Managed Care, Other (non HMO) | Admitting: Internal Medicine

## 2019-12-08 ENCOUNTER — Ambulatory Visit: Payer: Managed Care, Other (non HMO) | Admitting: Internal Medicine

## 2020-08-09 ENCOUNTER — Other Ambulatory Visit: Payer: Self-pay | Admitting: Internal Medicine

## 2020-08-09 ENCOUNTER — Telehealth: Payer: Self-pay

## 2020-08-09 ENCOUNTER — Other Ambulatory Visit: Payer: Self-pay

## 2020-08-09 DIAGNOSIS — G2581 Restless legs syndrome: Secondary | ICD-10-CM

## 2020-08-09 MED ORDER — ROPINIROLE HCL 1 MG PO TABS: 3.0000 mg | ORAL_TABLET | Freq: Every day | ORAL | 0 refills | Status: AC

## 2020-08-09 MED ORDER — ROPINIROLE HCL 1 MG PO TABS: 1.0000 mg | ORAL_TABLET | Freq: Every day | ORAL | 0 refills | Status: DC

## 2020-08-09 NOTE — Telephone Encounter (Signed)
Call pt for appt for weight check.   KP

## 2020-08-09 NOTE — Telephone Encounter (Signed)
Schedule appt. For 3 months.   KP

## 2020-08-09 NOTE — Telephone Encounter (Signed)
Patient is set up for May for med refills.

## 2020-09-15 ENCOUNTER — Emergency Department: Payer: Managed Care, Other (non HMO)

## 2020-09-15 ENCOUNTER — Other Ambulatory Visit: Payer: Self-pay

## 2020-09-15 ENCOUNTER — Emergency Department
Admission: EM | Admit: 2020-09-15 | Discharge: 2020-09-15 | Disposition: A | Discharge status: Home / Self Care | Payer: Managed Care, Other (non HMO) | Attending: Emergency Medicine | Admitting: Emergency Medicine

## 2020-09-15 DIAGNOSIS — F1290 Cannabis use, unspecified, uncomplicated: Secondary | ICD-10-CM | POA: Diagnosis not present

## 2020-09-15 DIAGNOSIS — F10929 Alcohol use, unspecified with intoxication, unspecified: Secondary | ICD-10-CM

## 2020-09-15 DIAGNOSIS — Y908 Blood alcohol level of 240 mg/100 ml or more: Secondary | ICD-10-CM | POA: Insufficient documentation

## 2020-09-15 DIAGNOSIS — F10129 Alcohol abuse with intoxication, unspecified: Secondary | ICD-10-CM | POA: Diagnosis present

## 2020-09-15 LAB — BLOOD GAS, VENOUS
Acid-base deficit: 1.3 mmol/L (ref 0.0–2.0)
Bicarbonate: 27.6 mmol/L (ref 20.0–28.0)
O2 Saturation: 43.4 %
Patient temperature: 37
pCO2, Ven: 63 mmHg — ABNORMAL HIGH (ref 44.0–60.0)
pH, Ven: 7.25 (ref 7.250–7.430)
pO2, Ven: <31 mmHg — CL (ref 32.0–45.0)

## 2020-09-15 LAB — CBC WITH DIFFERENTIAL/PLATELET
Abs Immature Granulocytes: 0.06 10*3/uL (ref 0.00–0.07)
Basophils Absolute: 0.1 10*3/uL (ref 0.0–0.1)
Basophils Relative: 1 %
Eosinophils Absolute: 0.2 10*3/uL (ref 0.0–0.5)
Eosinophils Relative: 2 %
HCT: 43 % (ref 39.0–52.0)
Hemoglobin: 14.8 g/dL (ref 13.0–17.0)
Immature Granulocytes: 1 %
Lymphocytes Relative: 33 %
Lymphs Abs: 2.6 10*3/uL (ref 0.7–4.0)
MCH: 30.9 pg (ref 26.0–34.0)
MCHC: 34.4 g/dL (ref 30.0–36.0)
MCV: 89.8 fL (ref 80.0–100.0)
Monocytes Absolute: 0.5 10*3/uL (ref 0.1–1.0)
Monocytes Relative: 6 %
Neutro Abs: 4.4 10*3/uL (ref 1.7–7.7)
Neutrophils Relative %: 57 %
Platelets: 214 10*3/uL (ref 150–400)
RBC: 4.79 MIL/uL (ref 4.22–5.81)
RDW: 13.1 % (ref 11.5–15.5)
WBC: 7.7 10*3/uL (ref 4.0–10.5)
nRBC: 0 % (ref 0.0–0.2)

## 2020-09-15 LAB — COMPREHENSIVE METABOLIC PANEL
ALT: 23 U/L (ref 0–44)
AST: 22 U/L (ref 15–41)
Albumin: 4.6 g/dL (ref 3.5–5.0)
Alkaline Phosphatase: 58 U/L (ref 38–126)
Anion gap: 10 (ref 5–15)
BUN: 17 mg/dL (ref 6–20)
CO2: 23 mmol/L (ref 22–32)
Calcium: 8.6 mg/dL — ABNORMAL LOW (ref 8.9–10.3)
Chloride: 105 mmol/L (ref 98–111)
Creatinine, Ser: 1.18 mg/dL (ref 0.61–1.24)
GFR, Estimated: >60 mL/min (ref >60)
Glucose, Bld: 98 mg/dL (ref 70–99)
Potassium: 3.4 mmol/L — ABNORMAL LOW (ref 3.5–5.1)
Sodium: 138 mmol/L (ref 135–145)
Total Bilirubin: 0.6 mg/dL (ref 0.3–1.2)
Total Protein: 7.5 g/dL (ref 6.5–8.1)

## 2020-09-15 LAB — ETHANOL: Alcohol, Ethyl (B): 203 mg/dL — ABNORMAL HIGH (ref <10)

## 2020-09-15 MED ORDER — LACTATED RINGERS IV BOLUS
1000.0000 mL | Freq: Once | INTRAVENOUS | Status: AC
  Administered 2020-09-15: 1000 mL via INTRAVENOUS

## 2020-09-15 NOTE — ED Provider Notes (Signed)
[facility] Emergency Department Provider Note  ____________________________________________  Time seen: Approximately 12:49 AM  I have reviewed the triage vital signs and the nursing notes.   HISTORY  Chief Complaint Alcohol Intoxication  Level 5 caveat:  Portions of the history and physical were unable to be obtained due to intoxication   HPI Benjamin Santiago is a 30 y.o. male no significant past medical history who presents via EMS from a friend's house for intoxication.  According to the friend patient had a lot to drink today and also marijuana.  Patient was found snoring on the floor of the friend's house covered in vomit.  No signs of trauma.   PMH unknown  Allergies Patient has no allergy information on record.  No family history on file.  Social History  Alcohol - yes Drugs - marijuana  Review of Systems  Constitutional: Negative for fever. + inoxication   Level 5 caveat:  Portions of the history and physical were unable to be obtained due to intoxication  ____________________________________________   PHYSICAL EXAM:  VITAL SIGNS: ED Triage Vitals  Enc Vitals Group     BP 09/15/20 0038 109/79     Pulse Rate 09/15/20 0030 74     Resp 09/15/20 0030 19     Temp 09/15/20 0038 (!) 97.5 F (36.4 C)     Temp Source 09/15/20 0038 Axillary     SpO2 09/15/20 0030 100 %     Weight 09/15/20 0027 275 lb (124.7 kg)     Height 09/15/20 0027 5\' 8"  (1.727 m)     Head Circumference --      Peak Flow --      Pain Score 09/15/20 0027 Asleep     Pain Loc --      Pain Edu? --      Excl. in GC? --     Constitutional: Snoring, moans and will forcefully push me away with sternal rub HEENT:      Head: Normocephalic and atraumatic.         Eyes: Conjunctivae are normal. Sclera is non-icteric. Pupils 29mm and reactive bilaterally      Mouth/Throat: Mucous membranes are moist.       Neck: Supple with no signs of meningismus. Cardiovascular:  Regular rate and rhythm. No murmurs, gallops, or rubs. 2+ symmetrical distal pulses are present in all extremities. No JVD. Respiratory: Normal respiratory effort. Lungs are clear to auscultation bilaterally.  Gastrointestinal: Soft, and non distended  Musculoskeletal:  No edema, cyanosis, or erythema of extremities. Neurologic: Face is symmetric. Moving all extremities. Localizes to pain Skin: Skin is warm, dry and intact. No rash noted.  ____________________________________________   LABS (all labs ordered are listed, but only abnormal results are displayed)  Labs Reviewed  COMPREHENSIVE METABOLIC PANEL - Abnormal; Notable for the following components:      Result Value   Potassium 3.4 (*)    Calcium 8.6 (*)    All other components within normal limits  ETHANOL - Abnormal; Notable for the following components:   Alcohol, Ethyl (B) 203 (*)    All other components within normal limits  BLOOD GAS, VENOUS - Abnormal; Notable for the following components:   pCO2, Ven 63 (*)    pO2, Ven <31.0 (*)    All other components within normal limits  CBC WITH DIFFERENTIAL/PLATELET  URINE DRUG SCREEN, QUALITATIVE ([facility] ONLY)   ____________________________________________  EKG  ED ECG REPORT I, 11m, the attending physician, personally viewed and  interpreted this ECG.  Normal sinus rhythm, rate of 69, normal intervals with no ST elevations or depressions.   ____________________________________________  RADIOLOGY  I have personally reviewed the images performed during this visit and I agree with the Radiologist's read.   Interpretation by Radiologist:  CT Head Wo Contrast  Result Date: 09/15/2020 CLINICAL DATA:  Intoxicated and vomiting. Mental status changes of unknown cause. EXAM: CT HEAD WITHOUT CONTRAST TECHNIQUE: Contiguous axial images were obtained from the base of the skull through the vertex without intravenous contrast. COMPARISON:  None. FINDINGS: Brain: No  evidence of acute infarction, hemorrhage, hydrocephalus, extra-axial collection or mass lesion/mass effect. Vascular: No hyperdense vessel or unexpected calcification. Skull: Normal. Negative for fracture or focal lesion. Sinuses/Orbits: Mucosal thickening in the paranasal sinuses. No acute air-fluid levels. Mastoid air cells are clear. Other: Motion artifact limits portions of the examination. IMPRESSION: No acute intracranial abnormalities. Electronically Signed   By: Burman Nieves M.D.   On: 09/15/2020 01:09      ____________________________________________   PROCEDURES  Procedure(s) performed:yes .1-3 Lead EKG Interpretation Performed by: Nita Sickle, MD Authorized by: Nita Sickle, MD     Interpretation: normal     ECG rate assessment: normal     Rhythm: sinus rhythm     Ectopy: none     Conduction: normal     Critical Care performed:  None ____________________________________________   INITIAL IMPRESSION / ASSESSMENT AND PLAN / ED COURSE  30 y.o. male no significant past medical history who presents via EMS from a friend's house for intoxication, alcohol and MJ.  No signs of trauma on exam.  Patient is snoring, will moan to noxious stimuli.  Forcefully remove my hand from his chest with sternal rub.  Hemodynamically stable, protecting his airway with a good gag reflex, breathing normally.  Patient placed on end-tidal.  States he was found on the floor and is unable to provide history we will get a head CT.  We will get blood work and monitor patient very closely on telemetry.  No old medical records for review.  Ddx intoxication, trauma, intracranial hemorrhage    _________________________ 1:48 AM on 09/15/2020 -----------------------------------------  Mother is at bedside and came to the ED after patient's wife called her. Per her, patient drinks regularly. She is not aware of any trauma or drug use.  Labs showing no signs of alcoholic ketoacidosis.  Alcohol  level of 203.  Head CT visualized by me with no signs of acute traumatic injury, confirmed by radiology.  We will continue to monitor.   _________________________ 2:57 AM on 09/15/2020 -----------------------------------------  Patient was still pretty somnolent so a VBG was done as he has a history of apnea was snoring very loudly. PH 7.25 with pCO2 63.  Patient's mother was at bedside reports that patient usually sleeps very deeply and is very hard to wake him up and he does have a history of apnea but does not seem to use CPAP at home.  As soon as we put a BiPAP mask on the patient he woke up.  Is still obviously intoxicated but awake and alert with normal vital signs, normal work of breathing and sats.  At this time he is stable enough for discharge home with his mother. I did discussed with him and mother the importance of using CPAP at home for his apnea.  We discussed standard return precautions and follow-up with PCP  _____________________________________________ Please note:  Patient was evaluated in Emergency Department today for the symptoms described in  the history of present illness. Patient was evaluated in the context of the global COVID-19 pandemic, which necessitated consideration that the patient might be at risk for infection with the SARS-CoV-2 virus that causes COVID-19. Institutional protocols and algorithms that pertain to the evaluation of patients at risk for COVID-19 are in a state of rapid change based on information released by regulatory bodies including the CDC and federal and state organizations. These policies and algorithms were followed during the patient's care in the ED.  Some ED evaluations and interventions may be delayed as a result of limited staffing during the pandemic.   [place] Controlled Substance Database was reviewed by me. ____________________________________________   FINAL CLINICAL IMPRESSION(S) / ED DIAGNOSES   Final diagnoses:  Alcoholic  intoxication with complication (HCC)      NEW MEDICATIONS STARTED DURING THIS VISIT:  ED Discharge Orders    None       Note:  This document was prepared using Dragon voice recognition software and may include unintentional dictation errors.    Don Perking, [place], MD 09/15/20 0400

## 2020-09-15 NOTE — ED Triage Notes (Signed)
Patient to ED rm 10 via EMS from a friend's house due to being intoxicated and vomited and they were concerned when he vomited and thought they saw blood.  Patient receiving normal saline via 18g angiocath to left antecub.

## 2020-09-15 NOTE — ED Notes (Signed)
Unable to complete screening or suicidal questions due to patient only being responsive to painful stimuli.

## 2020-09-15 NOTE — ED Notes (Signed)
Mother, Roanna Raider, to RN desk, reports pt's name as Benjamin Santiago, same DOB, [phone] -- called to go to room verified with Trula Ore RN

## 2020-11-01 ENCOUNTER — Other Ambulatory Visit: Payer: Self-pay | Admitting: Internal Medicine

## 2020-11-01 ENCOUNTER — Ambulatory Visit: Payer: Managed Care, Other (non HMO) | Admitting: Internal Medicine

## 2021-04-06 ENCOUNTER — Other Ambulatory Visit: Payer: Self-pay

## 2021-04-06 ENCOUNTER — Encounter: Payer: Self-pay | Admitting: Emergency Medicine

## 2021-04-06 ENCOUNTER — Ambulatory Visit
Admission: EM | Admit: 2021-04-06 | Discharge: 2021-04-06 | Disposition: A | Discharge status: Home / Self Care | Payer: Managed Care, Other (non HMO) | Attending: Emergency Medicine | Admitting: Emergency Medicine

## 2021-04-06 DIAGNOSIS — K029 Dental caries, unspecified: Secondary | ICD-10-CM

## 2021-04-06 MED ORDER — IBUPROFEN 800 MG PO TABS: 800.0000 mg | ORAL_TABLET | Freq: Three times a day (TID) | ORAL | 0 refills | Status: DC | PRN

## 2021-04-06 MED ORDER — AMOXICILLIN 875 MG PO TABS: 875.0000 mg | ORAL_TABLET | Freq: Two times a day (BID) | ORAL | 0 refills | Status: AC

## 2021-04-06 NOTE — ED Provider Notes (Signed)
Benjamin Santiago    CSN: [id number] Arrival date & time: 04/06/21  1253      History   Chief Complaint Chief Complaint  Patient presents with   Dental Pain    HPI Benjamin Santiago is a 30 y.o. male.  Patient presents with dental pain in bilateral upper and lower teeth due to dental caries.  The pain has been present for several months.  He has not seen a dentist.  He denies fever, chills, difficulty swallowing, or other symptoms.  Treatment at home with ibuprofen.  His medical history includes asthma, migraine headaches, restless leg syndrome, obesity.  He was seen at [facility] ED on 09/15/2020 for alcohol intoxication.  The history is provided by the patient and medical records.   History reviewed. No pertinent past medical history.  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 Authorizing Provider  amoxicillin (AMOXIL) 875 MG tablet Take 1 tablet (875 mg total) by mouth 2 (two) times daily for 7 days. 04/06/21 04/13/21 Yes Mickie Bail, NP  ibuprofen (ADVIL) 800 MG tablet Take 1 tablet (800 mg total) by mouth every 8 (eight) hours as needed. 04/06/21  Yes Mickie Bail, NP  albuterol (VENTOLIN HFA) 108 (90 Base) MCG/ACT inhaler Inhale 2 puffs into the lungs every 4 (four) hours as needed for wheezing or shortness of breath. 02/06/19    Reubin Milan, MD  pantoprazole (PROTONIX) 40 MG tablet Take 1 tablet (40 mg total) by mouth daily. 09/01/19   Reubin Milan, MD  rOPINIRole (REQUIP) 1 MG tablet Take 3 tablets (3 mg total) by mouth at bedtime. 08/09/20   Reubin Milan, MD  SUMAtriptan (IMITREX) 100 MG tablet TAKE 1 TABLET EVERY 2 HOURS AS NEEDED FOR MIGRAINE. MAY REPEAT IN 2HRS IF HEADACHE PERSISTS OR RECUR 02/06/19   Reubin Milan, MD  topiramate (TOPAMAX) 100 MG tablet Take 100 mg by mouth 2 (two) times daily. 08/24/20   [provider]    Family History Family History  Problem Relation Age of Onset   Diabetes Mother    Asthma Brother     Social History Social History   Tobacco Use   Smoking status: Former    Types: Cigarettes    Quit date: 10/31/2013    Years since quitting: 7.4   Smokeless tobacco: Never  Substance Use Topics   Alcohol use: No    Alcohol/week: 0.0 standard drinks   Drug use: No     Allergies   Patient has no known allergies.   Review of Systems Review of Systems  Constitutional:  Negative for chills and fever.  HENT:  Positive for dental problem. Negative for ear pain, sore throat and trouble swallowing.   Respiratory:  Negative for cough and shortness of breath.   Cardiovascular:  Negative for chest pain and palpitations.  All other  systems reviewed and are negative.   Physical Exam Triage Vital Signs ED Triage Vitals  Enc Vitals Group     BP      Pulse      Resp      Temp      Temp src      SpO2      Weight      Height      Head Circumference      Peak Flow      Pain Score      Pain Loc      Pain Edu?      Excl. in GC?    No data found.  Updated Vital Signs BP (!) 130/92 (BP Location: Left Arm)   Pulse 80   Temp 97.9 F (36.6 C) (Oral)   Resp 18   SpO2 98%   Visual Acuity Right Eye Distance:   Left Eye Distance:   Bilateral Distance:    Right Eye Near:   Left Eye Near:    Bilateral Near:     Physical Exam Vitals and nursing  note reviewed.  Constitutional:      General: He is not in acute distress.    Appearance: He is well-developed. He is obese.  HENT:     Head: Normocephalic and atraumatic.     Mouth/Throat:     Mouth: Mucous membranes are moist.     Dentition: Abnormal dentition. Dental tenderness and dental caries present.     Comments: Many teeth in poor repair. Eyes:     Conjunctiva/sclera: Conjunctivae normal.  Cardiovascular:     Rate and Rhythm: Normal rate and regular rhythm.     Heart sounds: No murmur heard. Pulmonary:     Effort: Pulmonary effort is normal. No respiratory distress.     Breath sounds: Normal breath sounds.  Abdominal:     Palpations: Abdomen is soft.     Tenderness: There is no abdominal tenderness.  Musculoskeletal:     Cervical back: Neck supple.  Skin:    General: Skin is warm and dry.  Neurological:     General: No focal deficit present.     Mental Status: He is alert and oriented to person, place, and time.     Gait: Gait normal.  Psychiatric:        Mood and Affect: Mood normal.        Behavior: Behavior normal.     UC Treatments / Results  Labs (all labs ordered are listed, but only abnormal results are displayed) Labs Reviewed - No data to display  EKG   Radiology No results found.  Procedures Procedures (including critical care time)  Medications Ordered in UC Medications - No data to display  Initial Impression / Assessment and Plan / UC Course  I have reviewed the triage vital signs and the nursing notes.  Pertinent labs & imaging results that were available during my care of the patient were reviewed by me and considered in my medical decision making (see chart for details).   Dental pain due to dental caries.  Treating with amoxicillin and ibuprofen.  Dental resource guide provided and instructed patient to make an appointment with a dentist as soon as possible.  ED precautions discussed.  Patient agrees to plan of care.   Final  Clinical Impressions(s) / UC Diagnoses   Final diagnoses:  Pain due to dental caries     Discharge Instructions      Take the antibiotic as prescribed.  A dental resource guide is attached.  Please call to make an appointment with a dentist as soon as possible.    Go to the emergency department if you have acute worsening symptoms.         ED Prescriptions     Medication Sig Dispense Auth. Provider   amoxicillin (AMOXIL) 875 MG tablet Take 1 tablet (875 mg total) by mouth 2 (two) times daily for 7 days. 14 tablet Wendee Beavers H, NP   ibuprofen (ADVIL) 800 MG tablet Take 1 tablet (800 mg total) by mouth every 8 (eight) hours as needed. 21 tablet Mickie Bail, NP      I have reviewed the PDMP during this encounter.   Mickie Bail, NP 04/06/21 1425

## 2021-04-06 NOTE — Discharge Instructions (Signed)
Take the antibiotic as prescribed.    A dental resource guide is attached.  Please call to make an appointment with a dentist as soon as possible.    Go to the emergency department if you have acute worsening symptoms.     

## 2021-04-06 NOTE — ED Triage Notes (Signed)
Pt here with severe dental pain from broken teeth. Believes he also has an infection. Intends on going to the dental walk-in clinic this coming week.

## 2023-02-06 ENCOUNTER — Other Ambulatory Visit: Payer: Self-pay

## 2023-02-06 ENCOUNTER — Emergency Department: Payer: 59

## 2023-02-06 ENCOUNTER — Emergency Department
Admission: EM | Admit: 2023-02-06 | Discharge: 2023-02-06 | Disposition: A | Discharge status: Home / Self Care | Payer: 59 | Attending: Emergency Medicine | Admitting: Emergency Medicine

## 2023-02-06 DIAGNOSIS — J45909 Unspecified asthma, uncomplicated: Secondary | ICD-10-CM | POA: Insufficient documentation

## 2023-02-06 DIAGNOSIS — N201 Calculus of ureter: Secondary | ICD-10-CM | POA: Insufficient documentation

## 2023-02-06 DIAGNOSIS — R109 Unspecified abdominal pain: Secondary | ICD-10-CM | POA: Diagnosis present

## 2023-02-06 LAB — BASIC METABOLIC PANEL
Anion gap: 9 (ref 5–15)
BUN: 16 mg/dL (ref 6–20)
CO2: 24 mmol/L (ref 22–32)
Calcium: 9 mg/dL (ref 8.9–10.3)
Chloride: 108 mmol/L (ref 98–111)
Creatinine, Ser: 1.33 mg/dL — ABNORMAL HIGH (ref 0.61–1.24)
GFR, Estimated: >60 mL/min (ref >60)
Glucose, Bld: 109 mg/dL — ABNORMAL HIGH (ref 70–99)
Potassium: 3.1 mmol/L — ABNORMAL LOW (ref 3.5–5.1)
Sodium: 141 mmol/L (ref 135–145)

## 2023-02-06 LAB — URINALYSIS, ROUTINE W REFLEX MICROSCOPIC
Bilirubin Urine: NEGATIVE
Glucose, UA: NEGATIVE mg/dL
Ketones, ur: 5 mg/dL — AB
Leukocytes,Ua: NEGATIVE
Nitrite: NEGATIVE
Protein, ur: 100 mg/dL — AB
RBC / HPF: >50 RBC/hpf (ref 0–5)
Specific Gravity, Urine: 1.02 (ref 1.005–1.030)
Squamous Epithelial / HPF: NONE SEEN /HPF (ref 0–5)
WBC, UA: NONE SEEN WBC/hpf (ref 0–5)
pH: 7 (ref 5.0–8.0)

## 2023-02-06 LAB — CBC
HCT: 38.5 % — ABNORMAL LOW (ref 39.0–52.0)
Hemoglobin: 13.5 g/dL (ref 13.0–17.0)
MCH: 31.3 pg (ref 26.0–34.0)
MCHC: 35.1 g/dL (ref 30.0–36.0)
MCV: 89.3 fL (ref 80.0–100.0)
Platelets: 247 10*3/uL (ref 150–400)
RBC: 4.31 MIL/uL (ref 4.22–5.81)
RDW: 12.6 % (ref 11.5–15.5)
WBC: 6.2 10*3/uL (ref 4.0–10.5)
nRBC: 0 % (ref 0.0–0.2)

## 2023-02-06 MED ORDER — OXYCODONE-ACETAMINOPHEN 5-325 MG PO TABS: 1.0000 | ORAL_TABLET | ORAL | 0 refills | Status: AC | PRN

## 2023-02-06 MED ORDER — SODIUM CHLORIDE 0.9 % IV BOLUS
1000.0000 mL | Freq: Once | INTRAVENOUS | Status: AC
  Administered 2023-02-06: 1000 mL via INTRAVENOUS

## 2023-02-06 MED ORDER — KETOROLAC TROMETHAMINE 30 MG/ML IJ SOLN
30.0000 mg | Freq: Once | INTRAMUSCULAR | Status: AC
  Administered 2023-02-06: 30 mg via INTRAVENOUS
  Filled 2023-02-06: qty 1

## 2023-02-06 MED ORDER — HYDROMORPHONE HCL 1 MG/ML IJ SOLN
1.0000 mg | Freq: Once | INTRAMUSCULAR | Status: AC
  Administered 2023-02-06: 1 mg via INTRAVENOUS
  Filled 2023-02-06: qty 1

## 2023-02-06 MED ORDER — IBUPROFEN 600 MG PO TABS: 600.0000 mg | ORAL_TABLET | Freq: Four times a day (QID) | ORAL | 0 refills | Status: AC | PRN

## 2023-02-06 NOTE — ED Triage Notes (Signed)
Pt via POV from home. Pt c/o L sided flank pain that started this AM around 0300, states he has been also unable to urine since then. Pt is A&Ox4 and NAD.

## 2023-02-06 NOTE — ED Provider Notes (Signed)
[facility] Provider Note    Event Date/Time   First MD Initiated Contact with Patient 02/06/23 1111     (approximate)   History   Flank Pain   HPI  Benjamin Santiago is a 32 y.o. male with a history of asthma, migraine, OSA, and GERD who presents with left flank pain, acute onset last night, associated with nausea and 1 episode of vomiting.  The patient also reports urinary hesitancy.  He feels he needs to urinate but has had very little urine output since last night.  He states his urine appeared cloudy but denied any blood in the urine.  He has no fever or chills.  I reviewed the past medical records.  The patient's most recent outpatient encounter was with internal medicine in 2021 for routine follow-up.   Physical Exam   Triage Vital Signs: ED Triage Vitals  Encounter Vitals Group     BP 02/06/23 1048 (!) 139/97     Systolic BP Percentile --      Diastolic BP Percentile --      Pulse Rate 02/06/23 1048 (!) 50     Resp 02/06/23 1048 16     Temp 02/06/23 1048 97.9 F (36.6 C)     Temp Source 02/06/23 1048 Oral     SpO2 02/06/23 1048 100 %     Weight 02/06/23 1042 248 lb (112.5 kg)     Height 02/06/23 1042 5\' 6"  (1.676 m)     Head Circumference --      Peak Flow --      Pain Score 02/06/23 1042 9     Pain Loc --      Pain Education --      Exclude from Growth Chart --     Most recent vital signs: Vitals:   02/06/23 1048  BP: (!) 139/97  Pulse: (!) 50  Resp: 16  Temp: 97.9 F (36.6 C)  SpO2: 100%     General: Awake, no distress.  CV:  Good peripheral perfusion.  Resp:  Normal effort.  Abd:  Soft and nontender.  No distention.  Other:  Mild left flank tenderness.   ED Results / Procedures / Treatments   Labs (all labs ordered are listed, but only abnormal results are displayed) Labs Reviewed  URINALYSIS, ROUTINE W REFLEX MICROSCOPIC - Abnormal; Notable for the following components:      Result Value   Color, Urine YELLOW (*)     APPearance TURBID (*)    Hgb urine dipstick MODERATE (*)    Ketones, ur 5 (*)    Protein, ur 100 (*)    Bacteria, UA RARE (*)    All other components within normal limits  BASIC METABOLIC PANEL - Abnormal; Notable for the following components:   Potassium 3.1 (*)    Glucose, Bld 109 (*)    Creatinine, Ser 1.33 (*)    All other components within normal limits  CBC - Abnormal; Notable for the following components:   HCT 38.5 (*)    All other components within normal limits     EKG     RADIOLOGY  CT renal stone study: I independently viewed and interpreted the images; there is a left UVJ stone   PROCEDURES:  Critical Care performed: No  Procedures   MEDICATIONS ORDERED IN ED: Medications  sodium chloride 0.9 % bolus 1,000 mL (0 mLs Intravenous Stopped 02/06/23 1418)  ketorolac (TORADOL) 30 MG/ML injection 30 mg (30 mg Intravenous Given 02/06/23  1132)  HYDROmorphone (DILAUDID) injection 1 mg (1 mg Intravenous Given 02/06/23 1214)     IMPRESSION / MDM / ASSESSMENT AND PLAN / ED COURSE  I reviewed the triage vital signs and the nursing notes.  32 year old male with PMH as noted above presents with left flank pain since last night associated with urinary hesitancy and cloudy urine.  On exam the patient is relatively well-appearing.  Vital signs are normal.  Abdomen is soft and nontender.  Differential diagnosis includes, but is not limited to, ureteral stone, UTI/pyelonephritis, musculoskeletal pain.  We will obtain basic labs, urinalysis, CT, and give fluids and IV Toradol for pain.  Patient's presentation is most consistent with acute complicated illness / injury requiring diagnostic workup.  ----------------------------------------- 2:25 PM on 02/06/2023 -----------------------------------------  CT shows a left UVJ stone.  Urinalysis shows RBCs but no other acute findings.  Creatinine is minimally elevated.  On reassessment, the patient's pain is well-controlled.   He has been comfortable for the last several hours.  At this time he is stable for discharge.  I have given him a prescription for pain medication as well as urology follow-up.  I counseled him on the results of the workup and plan of care.  I answered all of his questions and he expressed understanding and agreement with the plan.  FINAL CLINICAL IMPRESSION(S) / ED DIAGNOSES   Final diagnoses:  Ureteral stone     Rx / DC Orders   ED Discharge Orders          Ordered    oxyCODONE-acetaminophen (PERCOCET) 5-325 MG tablet  Every 4 hours PRN        02/06/23 1424    ibuprofen (ADVIL) 600 MG tablet  Every 6 hours PRN        02/06/23 1424             Note:  This document was prepared using Dragon voice recognition software and may include unintentional dictation errors.    Dionne Bucy, MD 02/06/23 [phone]
